# Patient Record
Sex: Male | Born: 1975 | Race: Black or African American | Hispanic: No | Marital: Single | Smoking: Former smoker
Health system: Southern US, Community
[De-identification: ages and names within clinical notes are randomized; demographics above are authoritative.]

## PROBLEM LIST (undated history)

## (undated) DIAGNOSIS — Z8719 Personal history of other diseases of the digestive system: Secondary | ICD-10-CM

## (undated) HISTORY — DX: Personal history of other diseases of the digestive system: Z87.19

## (undated) HISTORY — PX: COLONOSCOPY: SHX174

---

## 2011-06-15 DIAGNOSIS — Z0000 Encounter for general adult medical examination without abnormal findings: Secondary | ICD-10-CM | POA: Insufficient documentation

## 2011-06-15 DIAGNOSIS — Z8719 Personal history of other diseases of the digestive system: Secondary | ICD-10-CM | POA: Insufficient documentation

## 2011-06-15 HISTORY — DX: Encounter for general adult medical examination without abnormal findings: Z00.00

## 2016-10-27 ENCOUNTER — Encounter: Payer: Self-pay | Admitting: Emergency Medicine

## 2016-10-27 ENCOUNTER — Emergency Department: Payer: Self-pay

## 2016-10-27 ENCOUNTER — Emergency Department
Admission: EM | Admit: 2016-10-27 | Discharge: 2016-10-27 | Disposition: A | Discharge status: Home / Self Care | Payer: Self-pay | Attending: Emergency Medicine | Admitting: Emergency Medicine

## 2016-10-27 DIAGNOSIS — W2101XA Struck by football, initial encounter: Secondary | ICD-10-CM | POA: Insufficient documentation

## 2016-10-27 DIAGNOSIS — Y9361 Activity, american tackle football: Secondary | ICD-10-CM | POA: Insufficient documentation

## 2016-10-27 DIAGNOSIS — Y929 Unspecified place or not applicable: Secondary | ICD-10-CM | POA: Insufficient documentation

## 2016-10-27 DIAGNOSIS — Y998 Other external cause status: Secondary | ICD-10-CM | POA: Insufficient documentation

## 2016-10-27 DIAGNOSIS — F17210 Nicotine dependence, cigarettes, uncomplicated: Secondary | ICD-10-CM | POA: Insufficient documentation

## 2016-10-27 DIAGNOSIS — S022XXA Fracture of nasal bones, initial encounter for closed fracture: Secondary | ICD-10-CM | POA: Insufficient documentation

## 2016-10-27 MED ORDER — OXYCODONE-ACETAMINOPHEN 5-325 MG PO TABS
2.0000 | ORAL_TABLET | Freq: Once | ORAL | Status: AC
  Administered 2016-10-27: 2 via ORAL
  Filled 2016-10-27: qty 2

## 2016-10-27 MED ORDER — OXYCODONE-ACETAMINOPHEN 5-325 MG PO TABS: 1.0000 | ORAL_TABLET | Freq: Four times a day (QID) | ORAL | 0 refills | Status: DC | PRN

## 2016-10-27 MED ORDER — ONDANSETRON 4 MG PO TBDP
4.0000 mg | ORAL_TABLET | Freq: Once | ORAL | Status: AC
  Administered 2016-10-27: 4 mg via ORAL
  Filled 2016-10-27: qty 1

## 2016-10-27 NOTE — Discharge Instructions (Signed)
Please contact the ENT doctor should she continue to have swelling and pain. Continue to place ice to the area and take the oxycodone.

## 2016-10-27 NOTE — ED Triage Notes (Signed)
Pt presents to ED with bruising and swelling to his left eye after he was struck in the bridge of his nose with a football Sunday. Pt reports he noticed the bruising and slight swelling spreading to his right eye as well. Denies any other injury. Denies loc

## 2016-10-27 NOTE — ED Provider Notes (Signed)
[facility] Emergency Department Provider Note   ____________________________________________   First MD Initiated Contact with Patient 10/27/16 [phone]     (approximate)  I have reviewed the triage vital signs and the nursing notes.   HISTORY  Chief Complaint Facial Swelling and Facial Injury    HPI Erik Snyder is a 41 y.o. male who comes into the hospital today with some facial pain and swelling. He reports he is playing football on Sunday when he turned his head and was hit in the nose with a football. He reports it was not too bad on Monday but this morning when he woke up his left eye was swollen and he could barely open it. He also reports that the pain is worse approximately a 10 out of 10 in intensity. The patient did not take anything for pain. He reports that he feels that something is sticking him under the skin. He was dazed when he was hit with a ball but no syncope and no nausea or vomiting. He is here today for evaluation.   History reviewed. No pertinent past medical history.  There are no active problems to display for this patient.   History reviewed. No pertinent surgical history.  Prior to Admission medications   Medication Sig Start Date End Date Taking? Authorizing Provider  oxyCODONE-acetaminophen (ROXICET) 5-325 MG tablet Take 1 tablet by mouth every 6 (six) hours as needed. 10/27/16   Rebecka Apley, MD    Allergies Morphine and related  No family history on file.  Social History Social History  Substance Use Topics  . Smoking status: Current Every Day Smoker    Packs/day: 0.50    Types: Cigarettes  . Smokeless tobacco: Never Used  . Alcohol use Yes    Review of Systems Constitutional: No fever/chills Eyes: No visual changes. ENT: facial swelling and pain Cardiovascular: Denies chest pain. Respiratory: Denies shortness of breath. Gastrointestinal: No abdominal pain.  No nausea, no vomiting.  No  diarrhea.  No constipation. Genitourinary: Negative for dysuria. Musculoskeletal: Negative for back pain. Skin: facial bruising Neurological: Negative for headaches, focal weakness or numbness.  10-point ROS otherwise negative.  ____________________________________________   PHYSICAL EXAM:  VITAL SIGNS: ED Triage Vitals  Enc Vitals Group     BP 10/27/16 0523 92/78     Pulse Rate 10/27/16 0523 72     Resp 10/27/16 0523 18     Temp 10/27/16 0523 98.5 F (36.9 C)     Temp Source 10/27/16 0523 Oral     SpO2 10/27/16 0523 99 %     Weight 10/27/16 0524 160 lb (72.6 kg)     Height 10/27/16 0524 5\' 10"  (1.778 m)     Head Circumference --      Peak Flow --      Pain Score 10/27/16 0524 10     Pain Loc --      Pain Edu? --      Excl. in GC? --     Constitutional: Alert and oriented. Well appearing and in mild distress. Eyes: Conjunctivae are normal. PERRL. EOMI. Head: Atraumatic. Nose: Tenderness to palpation over nasal bridge Mouth/Throat: Mucous membranes are moist.  Oropharynx non-erythematous. Neck: No cervical spine tenderness to palpation. Cardiovascular: Normal rate, regular rhythm. Grossly normal heart sounds.  Good peripheral circulation. Respiratory: Normal respiratory effort.  No retractions. Lungs CTAB. Gastrointestinal: Soft and nontender. No distention. Positive bowel sounds Musculoskeletal: No lower extremity tenderness nor edema.  Neurologic:  Normal speech and  language.  Skin:  Skin is warm, dry and intact. Bruising underneath bilateral eyes Psychiatric: Mood and affect are normal.   ____________________________________________   LABS (all labs ordered are listed, but only abnormal results are displayed)  Labs Reviewed - No data to display ____________________________________________  EKG  none ____________________________________________  RADIOLOGY  CT max/face ____________________________________________   PROCEDURES  Procedure(s)  performed: None  Procedures  Critical Care performed: No  ____________________________________________   INITIAL IMPRESSION / ASSESSMENT AND PLAN / ED COURSE  Pertinent labs & imaging results that were available during my care of the patient were reviewed by me and considered in my medical decision making (see chart for details).  This is a 41 year old male who comes into the hospital today with some facial swelling and pain. I will send the patient for a CT maxillofacial to evaluate for possible nasal or orbital fracture. I will give the patient 2 Percocet.  Clinical Course as of Oct 28 627  Tue Oct 27, 2016  0627 Acute depressed LEFT nasal bone fracture and displaced osseous nasal septum fracture.  Frontal and midface soft tissue swelling without postseptal hematoma.  Poor dentition.   CT Maxillofacial Wo Contrast [AW]    Clinical Course User Index [AW] Rebecka ApleyAllison P Webster, MD     ____________________________________________   FINAL CLINICAL IMPRESSION(S) / ED DIAGNOSES  Final diagnoses:  Closed fracture of nasal bone, initial encounter      NEW MEDICATIONS STARTED DURING THIS VISIT:  New Prescriptions   OXYCODONE-ACETAMINOPHEN (ROXICET) 5-325 MG TABLET    Take 1 tablet by mouth every 6 (six) hours as needed.     Note:  This document was prepared using Dragon voice recognition software and may include unintentional dictation errors.    Rebecka ApleyAllison P Webster, MD 10/27/16 [phone]

## 2016-10-28 ENCOUNTER — Telehealth: Payer: Self-pay | Admitting: Emergency Medicine

## 2016-10-28 NOTE — Telephone Encounter (Signed)
Pt called 0350 2/14 to request extra day off per his conversation with the doctor; pt informed that his work note was only for 2/14 and he was to f/u with Dr Andee PolesVaught, ENT in 2 days from exam; pt st he was not informed of such; spoke with Dr Zenda AlpersWebster who st pt only to have the 1 day off and to f/u with Dr Andee PolesVaught as she did instruct him when she seen him; pt st that he would like an extra day off because of his swelling; per MD pt informed that his swelling/pain could last several weeks and again only 1 day off excused; pt st "I don't think you understand, I'm just gonna have to come back up there and get seen again"

## 2017-06-04 ENCOUNTER — Emergency Department: Payer: No Typology Code available for payment source

## 2017-06-04 ENCOUNTER — Emergency Department
Admission: EM | Admit: 2017-06-04 | Discharge: 2017-06-04 | Disposition: A | Discharge status: Home / Self Care | Payer: No Typology Code available for payment source | Attending: Emergency Medicine | Admitting: Emergency Medicine

## 2017-06-04 DIAGNOSIS — F17210 Nicotine dependence, cigarettes, uncomplicated: Secondary | ICD-10-CM | POA: Diagnosis not present

## 2017-06-04 DIAGNOSIS — S99912A Unspecified injury of left ankle, initial encounter: Secondary | ICD-10-CM | POA: Diagnosis present

## 2017-06-04 DIAGNOSIS — X509XXA Other and unspecified overexertion or strenuous movements or postures, initial encounter: Secondary | ICD-10-CM | POA: Diagnosis not present

## 2017-06-04 DIAGNOSIS — Y929 Unspecified place or not applicable: Secondary | ICD-10-CM | POA: Diagnosis not present

## 2017-06-04 DIAGNOSIS — Y998 Other external cause status: Secondary | ICD-10-CM | POA: Diagnosis not present

## 2017-06-04 DIAGNOSIS — S93402A Sprain of unspecified ligament of left ankle, initial encounter: Secondary | ICD-10-CM | POA: Insufficient documentation

## 2017-06-04 DIAGNOSIS — Y9301 Activity, walking, marching and hiking: Secondary | ICD-10-CM | POA: Diagnosis not present

## 2017-06-04 MED ORDER — OXYCODONE-ACETAMINOPHEN 5-325 MG PO TABS: 1.0000 | ORAL_TABLET | ORAL | 0 refills | Status: DC | PRN

## 2017-06-04 NOTE — ED Provider Notes (Signed)
[facility] Emergency Department Provider Note    First MD Initiated Contact with Patient 06/04/17 [phone]     (approximate)  I have reviewed the triage vital signs and the nursing notes.   HISTORY  Chief Complaint Ankle Pain    HPI Erik Snyder is a 41 y.o. male presents with history of actually twisting his ankle last night while coming down staircase. Patient states that he "rolled his left ankle with resultant 8 out of 10 pain.   Past medical history None There are no active problems to display for this patient.   Past surgical history None  Prior to Admission medications   Medication Sig Start Date End Date Taking? Authorizing Provider  oxyCODONE-acetaminophen (ROXICET) 5-325 MG tablet Take 1 tablet by mouth every 6 (six) hours as needed. 10/27/16   Rebecka Apley, MD  oxyCODONE-acetaminophen (ROXICET) 5-325 MG tablet Take 1 tablet by mouth every 4 (four) hours as needed for severe pain. 06/04/17   Darci Current, MD    Allergies Morphine and related  No family history on file.  Social History Social History  Substance Use Topics  . Smoking status: Current Every Day Smoker    Packs/day: 0.50    Types: Cigarettes  . Smokeless tobacco: Never Used  . Alcohol use Yes    Review of Systems Constitutional: No fever/chills Eyes: No visual changes. ENT: No sore throat. Cardiovascular: Denies chest pain. Respiratory: Denies shortness of breath. Gastrointestinal: No abdominal pain.  No nausea, no vomiting.  No diarrhea.  No constipation.Positive for left ankle pain Genitourinary: Negative for dysuria. Musculoskeletal: Negative for neck pain.  Negative for back pain. Integumentary: Negative for rash. Neurological: Negative for headaches, focal weakness or numbness.   ____________________________________________   PHYSICAL EXAM:  VITAL SIGNS: ED Triage Vitals  Enc Vitals Group     BP 06/04/17 0557 (!) 120/91     Pulse  Rate 06/04/17 0557 83     Resp 06/04/17 0557 20     Temp 06/04/17 0557 98 F (36.7 C)     Temp Source 06/04/17 0557 Oral     SpO2 06/04/17 0557 100 %     Weight 06/04/17 0553 70.3 kg (155 lb)     Height 06/04/17 0553 1.778 m ( )     Head Circumference --      Peak Flow --      Pain Score 06/04/17 0553 8     Pain Loc --      Pain Edu? --      Excl. in GC? --    Constitutional: Alert and oriented. Well appearing and in no acute distress. Eyes: Conjunctivae are normal.  Head: Atraumatic. Mouth/Throat: Mucous membranes are moist. Oropharynx non-erythematous. Neck: No stridor.   Cardiovascular: Normal rate, regular rhythm. Good peripheral circulation. Grossly normal heart sounds. Respiratory: Normal respiratory effort.  No retractions. Lungs CTAB. Gastrointestinal: Soft and nontender. No distention.  Musculoskeletal: Pain with palpation left lateral malleoli, no appreciable swelling. Pain with active/passive range of motion ankle Neurologic:  Normal speech and language. No gross focal neurologic deficits are appreciated.  Skin:  Skin is warm, dry and intact. No rash noted.    RADIOLOGY I, [employer] Viriginia Amendola, personally viewed and evaluated these images (plain radiographs) as part of my medical decision making, as well as reviewing the written report by the radiologist.  Dg Ankle Complete Left  Result Date: 06/04/2017 CLINICAL DATA:  Ankle injury EXAM: LEFT ANKLE COMPLETE - 3+ VIEW COMPARISON:  None. FINDINGS: There is no evidence of fracture, dislocation, or joint effusion. There is no evidence of arthropathy or other focal bone abnormality. Soft tissues are unremarkable. IMPRESSION: No acute abnormality of the left ankle. Electronically Signed   By: Deatra Robinson M.D.   On: 06/04/2017 06:48      Procedures   ____________________________________________   INITIAL IMPRESSION / ASSESSMENT AND PLAN / ED COURSE  Pertinent labs & imaging results that were available during my  care of the patient were reviewed by me and considered in my medical decision making (see chart for details).  Differential diagnosis: Left ankle sprain Left fibula avulsion fracture Left ankle dislocation  Given above clinical concern x-ray was performed which revealed no evidence of fracture or dislocation      ____________________________________________  FINAL CLINICAL IMPRESSION(S) / ED DIAGNOSES  Final diagnoses:  Sprain of left ankle, unspecified ligament, initial encounter     MEDICATIONS GIVEN DURING THIS VISIT:  Medications - No data to display   NEW OUTPATIENT MEDICATIONS STARTED DURING THIS VISIT:  Discharge Medication List as of 06/04/2017  7:06 AM      Discharge Medication List as of 06/04/2017  7:06 AM      Discharge Medication List as of 06/04/2017  7:06 AM       Note:  This document was prepared using Dragon voice recognition software and may include unintentional dictation errors.    Darci Current, MD 06/04/17 [phone]

## 2017-06-04 NOTE — ED Notes (Signed)
Pt states that he missed a step last night and thinks he sprained his left ankle.

## 2017-06-04 NOTE — ED Triage Notes (Signed)
Pt twisted left ankle yesterday states co pain, pt ambulatory upon arrival.

## 2017-11-09 ENCOUNTER — Emergency Department
Admission: EM | Admit: 2017-11-09 | Discharge: 2017-11-09 | Disposition: A | Discharge status: Home / Self Care | Payer: No Typology Code available for payment source | Attending: Emergency Medicine | Admitting: Emergency Medicine

## 2017-11-09 ENCOUNTER — Emergency Department
Admission: EM | Admit: 2017-11-09 | Discharge: 2017-11-09 | Disposition: A | Discharge status: Home / Self Care | Payer: No Typology Code available for payment source | Source: Home / Self Care

## 2017-11-09 ENCOUNTER — Other Ambulatory Visit: Payer: Self-pay

## 2017-11-09 ENCOUNTER — Encounter: Payer: Self-pay | Admitting: Emergency Medicine

## 2017-11-09 DIAGNOSIS — R51 Headache: Secondary | ICD-10-CM

## 2017-11-09 DIAGNOSIS — Z5321 Procedure and treatment not carried out due to patient leaving prior to being seen by health care provider: Secondary | ICD-10-CM

## 2017-11-09 DIAGNOSIS — J069 Acute upper respiratory infection, unspecified: Secondary | ICD-10-CM | POA: Diagnosis not present

## 2017-11-09 DIAGNOSIS — B9789 Other viral agents as the cause of diseases classified elsewhere: Secondary | ICD-10-CM | POA: Insufficient documentation

## 2017-11-09 DIAGNOSIS — R197 Diarrhea, unspecified: Secondary | ICD-10-CM | POA: Diagnosis not present

## 2017-11-09 DIAGNOSIS — R1110 Vomiting, unspecified: Secondary | ICD-10-CM | POA: Diagnosis not present

## 2017-11-09 DIAGNOSIS — F17210 Nicotine dependence, cigarettes, uncomplicated: Secondary | ICD-10-CM | POA: Diagnosis not present

## 2017-11-09 DIAGNOSIS — R0981 Nasal congestion: Secondary | ICD-10-CM | POA: Diagnosis present

## 2017-11-09 DIAGNOSIS — A084 Viral intestinal infection, unspecified: Secondary | ICD-10-CM | POA: Diagnosis not present

## 2017-11-09 LAB — INFLUENZA PANEL BY PCR (TYPE A & B)
INFLAPCR: NEGATIVE
Influenza B By PCR: NEGATIVE

## 2017-11-09 MED ORDER — ONDANSETRON 4 MG PO TBDP: 4.0000 mg | ORAL_TABLET | Freq: Three times a day (TID) | ORAL | 0 refills | Status: DC | PRN

## 2017-11-09 NOTE — ED Triage Notes (Signed)
Pt with headache started last night.

## 2017-11-09 NOTE — ED Provider Notes (Signed)
[facility] Emergency Department Provider Note  ____________________________________________   First MD Initiated Contact with Patient 11/09/17 1327     (approximate)  I have reviewed the triage vital signs and the nursing notes.   HISTORY  Chief Complaint Nasal Congestion; Diarrhea; and Otalgia   HPI Erik Snyder is a 42 y.o. male is here complaint of nasal congestion that started last evening.  Patient is also had vomiting and diarrhea.  He states in the last 12 hours he has had diarrhea approximately 6 times.  He has not vomited this morning.  Patient currently is drinking a soda and does not feel nauseous.  She rates his pain as a 10/10.  History reviewed. No pertinent past medical history.  There are no active problems to display for this patient.   History reviewed. No pertinent surgical history.  Prior to Admission medications   Medication Sig Start Date End Date Taking? Authorizing Provider  ondansetron (ZOFRAN ODT) 4 MG disintegrating tablet Take 1 tablet (4 mg total) by mouth every 8 (eight) hours as needed for nausea or vomiting. 11/09/17   [name], Suhail Peloquin L, PA-C    Allergies Morphine and related  No family history on file.  Social History Social History   Tobacco Use  . Smoking status: Current Every Day Smoker    Packs/day: 0.50    Types: Cigarettes  . Smokeless tobacco: Never Used  Substance Use Topics  . Alcohol use: Yes  . Drug use: No    Review of Systems Constitutional: No fever/chills Eyes: No visual changes. ENT: No sore throat.  Positive nasal congestion. Cardiovascular: Denies chest pain. Respiratory: Denies shortness of breath.  Positive cough. Gastrointestinal: No abdominal pain.  Positive nausea, positive vomiting.  Positive diarrhea.  No constipation. Genitourinary: Negative for dysuria. Musculoskeletal: Negative for back pain. Skin: Negative for rash. Neurological: Negative for headaches, focal  weakness or numbness. ____________________________________________   PHYSICAL EXAM:  VITAL SIGNS: ED Triage Vitals  Enc Vitals Group     BP 11/09/17 1212 118/80     Pulse Rate 11/09/17 1212 83     Resp 11/09/17 1212 16     Temp 11/09/17 1212 98.4 F (36.9 C)     Temp Source 11/09/17 1212 Oral     SpO2 --      Weight 11/09/17 1212 155 lb (70.3 kg)     Height --      Head Circumference --      Peak Flow --      Pain Score 11/09/17 1210 10     Pain Loc --      Pain Edu? --      Excl. in GC? --    Constitutional: Alert and oriented. Well appearing and in no acute distress. Eyes: Conjunctivae are normal.  Head: Atraumatic. Nose: Mild congestion/rhinnorhea.  TMs are dull bilaterally. Mouth/Throat: Mucous membranes are moist.  Oropharynx non-erythematous. Neck: No stridor.   Hematological/Lymphatic/Immunilogical: No cervical lymphadenopathy. Cardiovascular: Normal rate, regular rhythm. Grossly normal heart sounds.  Good peripheral circulation. Respiratory: Normal respiratory effort.  No retractions. Lungs CTAB. Gastrointestinal: Soft and nontender. No distention.  Bowel sounds are slightly hyperactive x4 quadrants. Musculoskeletal: No lower extremity tenderness nor edema.  No joint effusions. Neurologic:  Normal speech and language. No gross focal neurologic deficits are appreciated. No gait instability. Skin:  Skin is warm, dry and intact. No rash noted. Psychiatric: Mood and affect are normal. Speech and behavior are normal.  ____________________________________________   LABS (all labs ordered are  listed, but only abnormal results are displayed)  Labs Reviewed  INFLUENZA PANEL BY PCR (TYPE A & B)    PROCEDURES  Procedure(s) performed: None  Procedures  Critical Care performed: No  ____________________________________________   INITIAL IMPRESSION / ASSESSMENT AND PLAN / ED COURSE Patient was reassured that influenza test was negative.  He is to continue  drinking clear liquids.  Patient was given prescription for Zofran 1 every 8 hours if nausea vomiting.  Encouraged to take over-the-counter medication for his nasal congestion.  Follow-up with his PCP or [facility] acute care if any continued problems.  ____________________________________________   FINAL CLINICAL IMPRESSION(S) / ED DIAGNOSES  Final diagnoses:  Viral URI with cough  Viral gastroenteritis     ED Discharge Orders        Ordered    ondansetron (ZOFRAN ODT) 4 MG disintegrating tablet  Every 8 hours PRN     11/09/17 1404       Note:  This document was prepared using Dragon voice recognition software and may include unintentional dictation errors.    Tommi Rumps, PA-C 11/09/17 1503    Emily Filbert, MD 11/09/17 [phone]

## 2017-11-09 NOTE — ED Triage Notes (Signed)
Says nasal and head congestion, diarrhea since last night .  Has pain in left ear as well.

## 2017-11-11 ENCOUNTER — Encounter: Payer: Self-pay | Admitting: Emergency Medicine

## 2017-11-11 ENCOUNTER — Emergency Department
Admission: EM | Admit: 2017-11-11 | Discharge: 2017-11-11 | Disposition: A | Discharge status: Home / Self Care | Payer: No Typology Code available for payment source | Attending: Emergency Medicine | Admitting: Emergency Medicine

## 2017-11-11 ENCOUNTER — Other Ambulatory Visit: Payer: Self-pay

## 2017-11-11 DIAGNOSIS — B349 Viral infection, unspecified: Secondary | ICD-10-CM | POA: Insufficient documentation

## 2017-11-11 DIAGNOSIS — F17210 Nicotine dependence, cigarettes, uncomplicated: Secondary | ICD-10-CM | POA: Insufficient documentation

## 2017-11-11 DIAGNOSIS — R1110 Vomiting, unspecified: Secondary | ICD-10-CM | POA: Diagnosis present

## 2017-11-11 NOTE — ED Triage Notes (Signed)
Says no abd pain.  Says it's just a virus.

## 2017-11-11 NOTE — ED Provider Notes (Signed)
[facility] Emergency Department Provider Note   ____________________________________________   None    (approximate)  I have reviewed the triage vital signs and the nursing notes.   HISTORY  Chief Complaint Diarrhea and Emesis    HPI Erik Snyder is a 42 y.o. male patient is here today for extension of his work note from 2 days ago.  Patient has vomiting and diarrhea.  Patient he went to the case [facility] but decided to come to the emergency room because they had a 3-hour wait.  Patient states he does not want exam just a work note to extend him for the next 4 days.  Reviewed patient notes from previous visit showing a diagnosis of a viral illness.   History reviewed. No pertinent past medical history.  There are no active problems to display for this patient.   History reviewed. No pertinent surgical history.  Prior to Admission medications   Medication Sig Start Date End Date Taking? Authorizing Provider  ondansetron (ZOFRAN ODT) 4 MG disintegrating tablet Take 1 tablet (4 mg total) by mouth every 8 (eight) hours as needed for nausea or vomiting. 11/09/17   Tommi Rumps, PA-C    Allergies Morphine and related  No family history on file.  Social History Social History   Tobacco Use  . Smoking status: Current Every Day Smoker    Packs/day: 0.50    Types: Cigarettes  . Smokeless tobacco: Never Used  Substance Use Topics  . Alcohol use: Yes  . Drug use: No    Review of Systems Constitutional: No fever/chills Eyes: No visual changes. ENT: No sore throat. Cardiovascular: Denies chest pain. Respiratory: Denies shortness of breath. Gastrointestinal: No abdominal pain.  Vomiting episode last night.  Loose stools 1 hour prior to arrival.   Genitourinary: Negative for dysuria. Musculoskeletal: Negative for back pain. Skin: Negative for rash. Neurological: Negative for headaches, focal weakness or numbness. Allergic/Immunilogical:  Morphine and related products. ____________________________________________   PHYSICAL EXAM:  VITAL SIGNS: ED Triage Vitals  Enc Vitals Group     BP 11/11/17 1158 120/90     Pulse Rate 11/11/17 1154 81     Resp 11/11/17 1154 14     Temp 11/11/17 1154 98.5 F (36.9 C)     Temp Source 11/11/17 1154 Oral     SpO2 11/11/17 1154 100 %     Weight 11/11/17 1155 155 lb (70.3 kg)     Height --      Head Circumference --      Peak Flow --      Pain Score 11/11/17 1155 9     Pain Loc --      Pain Edu? --      Excl. in GC? --    Constitutional: Alert and oriented. Well appearing and in no acute distress.  Afebrile Cardiovascular: Normal rate, regular rhythm. Grossly normal heart sounds.  Good peripheral circulation. Respiratory: Normal respiratory effort.  No retractions. Lungs CTAB. Gastrointestinal: Soft and nontender. No distention. No abdominal bruits. No CVA tenderness. Neurologic:  Normal speech and language. No gross focal neurologic deficits are appreciated. No gait instability. Skin:  Skin is warm, dry and intact. No rash noted. Psychiatric: Mood and affect are normal. Speech and behavior are normal.  ____________________________________________   LABS (all labs ordered are listed, but only abnormal results are displayed)  Labs Reviewed - No data to display ____________________________________________  EKG   ____________________________________________  RADIOLOGY  ED MD interpretation:  Official radiology report(s): No results found.  ____________________________________________   PROCEDURES  Procedure(s) performed: None  Procedures  Critical Care performed: No  ____________________________________________   INITIAL IMPRESSION / ASSESSMENT AND PLAN / ED COURSE  As part of my medical decision making, I reviewed the following data within the electronic MEDICAL RECORD NUMBER    Resolving viral illness.  Patient advised to follow-up with urgent care/  family care clinic for continued complaint.      ____________________________________________   FINAL CLINICAL IMPRESSION(S) / ED DIAGNOSES  Final diagnoses:  Viral illness     ED Discharge Orders    None       Note:  This document was prepared using Dragon voice recognition software and may include unintentional dictation errors.    Joni ReiningSmith, Margert Edsall K, PA-C 11/11/17 1234    Emily FilbertWilliams, Jonathan E, MD 11/11/17 1240

## 2017-11-11 NOTE — Discharge Instructions (Signed)
Advised patient to follow-up with family practice clinic/urgent care clinic due to policy of giving  2 days work excuse.

## 2017-11-11 NOTE — ED Triage Notes (Signed)
Says diarrhea and vomiting continues.  Last vomited last night after eating soup.  Last diarrhea 1  Hour ago. He says he was going to [facility], but they have a 3 hour wait.  He knows what is wrong, but just needs another work note--says he wants to go through [employer] just for the note.

## 2017-11-11 NOTE — ED Notes (Signed)
See triage note  States he was seen 2 days ago for same sxs'   States he was still feeling nauseated and would like to have an extra extended work note

## 2018-03-08 ENCOUNTER — Other Ambulatory Visit: Payer: Self-pay

## 2018-03-08 ENCOUNTER — Encounter: Payer: Self-pay | Admitting: Emergency Medicine

## 2018-03-08 ENCOUNTER — Emergency Department
Admission: EM | Admit: 2018-03-08 | Discharge: 2018-03-08 | Disposition: A | Discharge status: Home / Self Care | Payer: No Typology Code available for payment source | Attending: Emergency Medicine | Admitting: Emergency Medicine

## 2018-03-08 DIAGNOSIS — J069 Acute upper respiratory infection, unspecified: Secondary | ICD-10-CM | POA: Insufficient documentation

## 2018-03-08 DIAGNOSIS — F17210 Nicotine dependence, cigarettes, uncomplicated: Secondary | ICD-10-CM | POA: Insufficient documentation

## 2018-03-08 MED ORDER — PSEUDOEPH-BROMPHEN-DM 30-2-10 MG/5ML PO SYRP: 5.0000 mL | ORAL_SOLUTION | Freq: Four times a day (QID) | ORAL | 0 refills | Status: DC | PRN

## 2018-03-08 NOTE — ED Notes (Signed)
Pt ambulatory to POV, without difficulty. VSS, NAD. Discharge instructions, RX and follow up given. All questions answered.

## 2018-03-08 NOTE — Discharge Instructions (Addendum)
Follow-up with [facility] acute care if any continued problems.  Increase fluids.  Use saline nose spray for nasal congestion frequently.  Begin taking Bromfed-DM as needed for cough and congestion.  You may also take Tylenol or ibuprofen if needed for body aches or fever.

## 2018-03-08 NOTE — ED Triage Notes (Signed)
Says yesterday started with congestion inhead, eyes hurt and nasal congestion.

## 2018-03-08 NOTE — ED Provider Notes (Signed)
[facility] Emergency Department Provider Note  ____________________________________________   First MD Initiated Contact with Patient 03/08/18 1227     (approximate)  I have reviewed the triage vital signs and the nursing notes.   HISTORY  Chief Complaint Nasal Congestion   HPI Erik Snyder is a 42 y.o. male is here with complaint of nasal congestion, cough and sneezing that started yesterday.  Patient took 1 dose of DayQuil and 1 dose of NyQuil and states he has had no relief.  He denies any fever, chills, nausea or vomiting.  Patient does smoke cigarettes.  He rates his pain as a 9/10.  History reviewed. No pertinent past medical history.  There are no active problems to display for this patient.   History reviewed. No pertinent surgical history.  Prior to Admission medications   Medication Sig Start Date End Date Taking? Authorizing Provider  brompheniramine-pseudoephedrine-DM 30-2-10 MG/5ML syrup Take 5 mLs by mouth 4 (four) times daily as needed. 03/08/18   [name], [name], PA-C  ondansetron (ZOFRAN ODT) 4 MG disintegrating tablet Take 1 tablet (4 mg total) by mouth every 8 (eight) hours as needed for nausea or vomiting. 11/09/17   [name], [name], PA-C    Allergies Morphine and related  No family history on file.  Social History Social History   Tobacco Use  . Smoking status: Current Every Day Smoker    Packs/day: 0.50    Types: Cigarettes  . Smokeless tobacco: Never Used  Substance Use Topics  . Alcohol use: Yes  . Drug use: No    Review of Systems Constitutional: No fever/chills Eyes: No visual changes. ENT: Positive nasal congestion.  Positive for sneezing. Cardiovascular: Denies chest pain. Respiratory: Denies shortness of breath.  Positive cough. Gastrointestinal: No abdominal pain.  No nausea, no vomiting.  No diarrhea.  Musculoskeletal: Negative for muscle aches. Skin: Negative for rash. Neurological:  Negative for headaches, focal weakness or numbness. ____________________________________________   PHYSICAL EXAM:  VITAL SIGNS: ED Triage Vitals  Enc Vitals Group     BP 03/08/18 1211 106/78     Pulse Rate 03/08/18 1211 81     Resp 03/08/18 1211 14     Temp 03/08/18 1211 98.5 F (36.9 C)     Temp Source 03/08/18 1211 Oral     SpO2 03/08/18 1211 97 %     Weight 03/08/18 1207 165 lb (74.8 kg)     Height 03/08/18 1207 5\' 10"  (1.778 m)     Head Circumference --      Peak Flow --      Pain Score 03/08/18 1207 9     Pain Loc --      Pain Edu? --      Excl. in GC? --     Constitutional: Alert and oriented. Well appearing and in no acute distress. Eyes: Conjunctivae are normal.  Head: Atraumatic. Nose: Moderate congestion/rhinnorhea.  TMs are dull bilaterally. Mouth/Throat: Mucous membranes are moist.  Oropharynx non-erythematous. Neck: No stridor.   Hematological/Lymphatic/Immunilogical: No cervical lymphadenopathy. Cardiovascular: Normal rate, regular rhythm. Grossly normal heart sounds.  Good peripheral circulation. Respiratory: Normal respiratory effort.  No retractions. Lungs CTAB. Musculoskeletal: His upper and lower extremities without any difficulty.  Normal gait was noted. Neurologic:  Normal speech and language. No gross focal neurologic deficits are appreciated.  Skin:  Skin is warm, dry and intact. No rash noted. Psychiatric: Mood and affect are normal. Speech and behavior are normal.  ____________________________________________   LABS (all labs  ordered are listed, but only abnormal results are displayed)  Labs Reviewed - No data to display ____________________________________________  PROCEDURES  Procedure(s) performed: None  Procedures  Critical Care performed: No  ____________________________________________   INITIAL IMPRESSION / ASSESSMENT AND PLAN / ED COURSE  As part of my medical decision making, I reviewed the following data within the  electronic MEDICAL RECORD NUMBER Notes from prior ED visits and [place] Controlled Substance Database  Patient was made aware that he has an upper respiratory infection.  He is continue taking Tylenol or ibuprofen as needed for body aches or headache.  He was given a prescription for Bromfed-DM 4 times daily as needed for cough and congestion.  Patient was given a note to remain out of work [employer].  He is encouraged to increase fluids and also decrease smoking. ____________________________________________   FINAL CLINICAL IMPRESSION(S) / ED DIAGNOSES  Final diagnoses:  Acute upper respiratory infection     ED Discharge Orders        Ordered    brompheniramine-pseudoephedrine-DM 30-2-10 MG/5ML syrup  4 times daily PRN     03/08/18 1253       Note:  This document was prepared using Dragon voice recognition software and may include unintentional dictation errors.    Tommi Rumps, PA-C 03/08/18 1453    Emily Filbert, MD 03/08/18 [phone]

## 2018-04-08 ENCOUNTER — Encounter: Payer: Self-pay | Admitting: Emergency Medicine

## 2018-04-08 ENCOUNTER — Other Ambulatory Visit: Payer: Self-pay

## 2018-04-08 ENCOUNTER — Emergency Department
Admission: EM | Admit: 2018-04-08 | Discharge: 2018-04-08 | Disposition: A | Discharge status: Home / Self Care | Payer: No Typology Code available for payment source | Attending: Emergency Medicine | Admitting: Emergency Medicine

## 2018-04-08 DIAGNOSIS — L659 Nonscarring hair loss, unspecified: Secondary | ICD-10-CM

## 2018-04-08 DIAGNOSIS — B350 Tinea barbae and tinea capitis: Secondary | ICD-10-CM | POA: Insufficient documentation

## 2018-04-08 DIAGNOSIS — F17210 Nicotine dependence, cigarettes, uncomplicated: Secondary | ICD-10-CM | POA: Insufficient documentation

## 2018-04-08 NOTE — Discharge Instructions (Signed)
Apply Lamisil cream twice daily for at least a month.  See a dermatologist if not improving in 1 to 2 weeks.  Clean the clippers very well as you may have fungal spores in your clippers.

## 2018-04-08 NOTE — ED Notes (Signed)
Patient discharged to home per MD order. Patient in stable condition, and deemed medically cleared by ED provider for discharge. Discharge instructions reviewed with patient/family using "Teach Back"; verbalized understanding of medication education and administration, and information about follow-up care. Denies further concerns. ° °

## 2018-04-08 NOTE — ED Provider Notes (Signed)
[facility] Emergency Department Provider Note  ____________________________________________   First MD Initiated Contact with Patient 04/08/18 2321     (approximate)  I have reviewed the triage vital signs and the nursing notes.   HISTORY  Chief Complaint Insect Bite    HPI Erik Snyder is a 42 y.o. male presents emergency department complaining of a questionable spider bite to the top of the head.  States his hair is missing from the site.  He denies any other injuries.  No fever chills or difficulty breathing.  History reviewed. No pertinent past medical history.  There are no active problems to display for this patient.   History reviewed. No pertinent surgical history.  Prior to Admission medications   Medication Sig Start Date End Date Taking? Authorizing Provider  brompheniramine-pseudoephedrine-DM 30-2-10 MG/5ML syrup Take 5 mLs by mouth 4 (four) times daily as needed. 03/08/18   [name], Rhonda L, PA-C  ondansetron (ZOFRAN ODT) 4 MG disintegrating tablet Take 1 tablet (4 mg total) by mouth every 8 (eight) hours as needed for nausea or vomiting. 11/09/17   [name], Rhonda L, PA-C    Allergies Morphine and related  History reviewed. No pertinent family history.  Social History Social History   Tobacco Use  . Smoking status: Current Every Day Smoker    Packs/day: 0.50    Types: Cigarettes  . Smokeless tobacco: Never Used  Substance Use Topics  . Alcohol use: Yes  . Drug use: No    Review of Systems  Constitutional: No fever/chills Eyes: No visual changes. ENT: No sore throat. Respiratory: Denies cough Genitourinary: Negative for dysuria. Musculoskeletal: Negative for back pain. Skin: Negative for rash.  Positive for an area of missing hair    ____________________________________________   PHYSICAL EXAM:  VITAL SIGNS: ED Triage Vitals  Enc Vitals Group     BP 04/08/18 2321 117/82     Pulse Rate 04/08/18 2321  90     Resp 04/08/18 2321 17     Temp 04/08/18 2321 97.7 F (36.5 C)     Temp Source 04/08/18 2321 Oral     SpO2 04/08/18 2321 100 %     Weight 04/08/18 2318 160 lb (72.6 kg)     Height 04/08/18 2318 5\' 10"  (1.778 m)     Head Circumference --      Peak Flow --      Pain Score 04/08/18 2318 6     Pain Loc --      Pain Edu? --      Excl. in GC? --     Constitutional: Alert and oriented. Well appearing and in no acute distress. Eyes: Conjunctivae are normal.  Head: Atraumatic. Nose: No congestion/rhinnorhea. Mouth/Throat: Mucous membranes are moist.   Cardiovascular: Normal rate, regular rhythm. Respiratory: Normal respiratory effort.  No retractions GU: deferred Musculoskeletal: FROM all extremities, warm and well perfused Neurologic:  Normal speech and language.  Skin:  Skin is warm, dry and intact.  The hair is broken in several areas.  There are areas of alopecia noted.  Typical of tinea capitis. psychiatric: Mood and affect are normal. Speech and behavior are normal.  ____________________________________________   LABS (all labs ordered are listed, but only abnormal results are displayed)  Labs Reviewed - No data to display ____________________________________________   ____________________________________________  RADIOLOGY    ____________________________________________   PROCEDURES  Procedure(s) performed: No  Procedures    ____________________________________________   INITIAL IMPRESSION / ASSESSMENT AND PLAN / ED COURSE  Pertinent labs & imaging results that were available during my care of the patient were reviewed by me and considered in my medical decision making (see chart for details).  Patient is a 42 year old male presents emergency department complaining of a spider bite to the top of his head tonight which took away the hair.  On physical exam patient appears well.  There is no obvious bite noted however there is several areas of  alopecia noted along the scalp.  Typical of tinea.  Explained findings to the patient.  He was instructed to use Lamisil cream.  He is to follow-up with dermatology if needed.  States he understands and was discharged in stable condition with a work note for [employer].     As part of my medical decision making, I reviewed the following data within the electronic MEDICAL RECORD NUMBER Nursing notes reviewed and incorporated, Notes from prior ED visits and [place] Controlled Substance Database  ____________________________________________   FINAL CLINICAL IMPRESSION(S) / ED DIAGNOSES  Final diagnoses:  Alopecia  Tinea capitis      NEW MEDICATIONS STARTED DURING THIS VISIT:  New Prescriptions   No medications on file     Note:  This document was prepared using Dragon voice recognition software and may include unintentional dictation errors.    Faythe Ghee, PA-C 04/08/18 2333    Merrily Brittle, MD 04/08/18 [phone]

## 2018-04-08 NOTE — ED Notes (Addendum)
Pt in with area to top of scalp that is missing hair and is painful. States he thinks it was an insect bite. No redness or swelling noted without signs of infection.

## 2018-04-08 NOTE — ED Triage Notes (Signed)
Pt reports he felt something biting his head about an hour ago. Rubbed his head and saw what he thought was spider legs. Pt has small area to his scalp that his hair is missing from.

## 2018-11-09 ENCOUNTER — Emergency Department
Admission: EM | Admit: 2018-11-09 | Discharge: 2018-11-09 | Disposition: A | Discharge status: Home / Self Care | Payer: BLUE CROSS/BLUE SHIELD | Attending: Emergency Medicine | Admitting: Emergency Medicine

## 2018-11-09 ENCOUNTER — Encounter: Payer: Self-pay | Admitting: Emergency Medicine

## 2018-11-09 ENCOUNTER — Other Ambulatory Visit: Payer: Self-pay

## 2018-11-09 DIAGNOSIS — R69 Illness, unspecified: Secondary | ICD-10-CM

## 2018-11-09 DIAGNOSIS — R509 Fever, unspecified: Secondary | ICD-10-CM | POA: Insufficient documentation

## 2018-11-09 DIAGNOSIS — F17210 Nicotine dependence, cigarettes, uncomplicated: Secondary | ICD-10-CM | POA: Diagnosis not present

## 2018-11-09 DIAGNOSIS — R05 Cough: Secondary | ICD-10-CM | POA: Diagnosis present

## 2018-11-09 DIAGNOSIS — M791 Myalgia, unspecified site: Secondary | ICD-10-CM | POA: Diagnosis not present

## 2018-11-09 DIAGNOSIS — J111 Influenza due to unidentified influenza virus with other respiratory manifestations: Secondary | ICD-10-CM | POA: Insufficient documentation

## 2018-11-09 MED ORDER — ACETAMINOPHEN 500 MG PO TABS
1000.0000 mg | ORAL_TABLET | Freq: Once | ORAL | Status: AC
  Administered 2018-11-09: 1000 mg via ORAL
  Filled 2018-11-09: qty 2

## 2018-11-09 NOTE — Discharge Instructions (Addendum)
Follow-up with your primary care provider or can no clinic acute care if any continued problems.  Continue taking Tamiflu twice a day until finished.  Also guaifenesin with codeine 1 teaspoon can be taken every 4-6 hours as needed for cough and congestion.  This medication contains codeine which will also help with body aches.  Continue taking Tylenol or ibuprofen every 4-6 hours as needed for body aches and fever.  Increase fluids.

## 2018-11-09 NOTE — ED Provider Notes (Signed)
[facility] Emergency Department Provider Note  ____________________________________________   None    (approximate)  I have reviewed the triage vital signs and the nursing notes.   HISTORY  Chief Complaint Cough and Fever   HPI Erik Snyder is a 43 y.o. male presents to the ED with complaint of cough, fever and body aches since yesterday.  He states he was seen at [facility] yesterday and tested for the flu.  Patient is unsure of the results of this test.  He was placed on Tamiflu and given a prescription for cough medication.  He has not taken any Tylenol or ibuprofen since last evening.  He has a subjective fever along with his continued body aches.  Currently rates pain as 10/10.    History reviewed. No pertinent past medical history.  There are no active problems to display for this patient.   History reviewed. No pertinent surgical history.  Prior to Admission medications   Medication Sig Start Date End Date Taking? Authorizing Provider  guaiFENesin-codeine 100-10 MG/5ML syrup Take 5 mLs by mouth every 6 (six) hours as needed for cough.   Yes [provider]  oseltamivir (TAMIFLU) 75 MG capsule Take 75 mg by mouth.   Yes [provider]    Allergies Morphine and related  No family history on file.  Social History Social History   Tobacco Use  . Smoking status: Current Every Day Smoker    Packs/day: 0.50    Types: Cigarettes  . Smokeless tobacco: Never Used  Substance Use Topics  . Alcohol use: Yes  . Drug use: No    Review of Systems Constitutional: Subjective fever/chills Eyes: No visual changes. ENT: No sore throat.  Positive nasal congestion. Cardiovascular: Denies chest pain. Respiratory: Denies shortness of breath.  Positive coughing. Gastrointestinal: No abdominal pain.  No nausea, no vomiting.  No diarrhea.  Musculoskeletal: Positive for muscle aches. Skin: Negative for  rash. Neurological: Negative for headaches, focal weakness or numbness. ____________________________________________   PHYSICAL EXAM:  VITAL SIGNS: ED Triage Vitals  Enc Vitals Group     BP 11/09/18 0651 111/63     Pulse Rate 11/09/18 0651 89     Resp 11/09/18 0651 20     Temp 11/09/18 0651 99.5 F (37.5 C)     Temp Source 11/09/18 0651 Oral     SpO2 11/09/18 0651 97 %     Weight 11/09/18 0653 155 lb (70.3 kg)     Height 11/09/18 0653 5\' 10"  (1.778 m)     Head Circumference --      Peak Flow --      Pain Score 11/09/18 0651 10     Pain Loc --      Pain Edu? --      Excl. in GC? --    Constitutional: Alert and oriented. Well appearing and in no acute distress. Eyes: Conjunctivae are normal.  Head: Atraumatic. Nose: Mild congestion/rhinnorhea.  TMs are dull bilaterally. Mouth/Throat: Mucous membranes are moist.  Oropharynx non-erythematous. Neck: No stridor.   Hematological/Lymphatic/Immunilogical: No cervical lymphadenopathy. Cardiovascular: Normal rate, regular rhythm. Grossly normal heart sounds.  Good peripheral circulation. Respiratory: Normal respiratory effort.  No retractions. Lungs CTAB. Gastrointestinal: Soft and nontender. No distention.  Musculoskeletal: Moves upper and lower extremities without any difficulty.  Normal gait was noted. Neurologic:  Normal speech and language. No gross focal neurologic deficits are appreciated. No gait instability. Skin:  Skin is warm, dry and intact. No rash noted. Psychiatric: Mood  and affect are normal. Speech and behavior are normal.  ____________________________________________   LABS (all labs ordered are listed, but only abnormal results are displayed)  Labs Reviewed - No data to display   PROCEDURES  Procedure(s) performed (including Critical Care):  Procedures   ____________________________________________   INITIAL IMPRESSION / ASSESSMENT AND PLAN / ED COURSE  As part of my medical decision making, I  reviewed the following data within the electronic MEDICAL RECORD NUMBER Notes from prior ED visits and [place] Controlled Substance Database  Patient presents to the ED with complaint of cough, congestion, body aches and subjective fever since yesterday.  Patient was seen at [facility] and prescribed Tamiflu and Robitussin-AC.  Patient was told to take the cough medication twice a day.  He is infrequently taken antipyretics for his fever and body aches.  Patient physical exam was consistent with a viral illness.  Chart from [facility] showed that his influenza test was negative.  Patient was made aware that he could take the Robitussin-AC every 4-6 hours as needed for cough and congestion.  He was given Tylenol 1000 mg p.o. while in the ED.  He is encouraged to increase fluids.  He was given a note to remain out of work. ____________________________________________   FINAL CLINICAL IMPRESSION(S) / ED DIAGNOSES  Final diagnoses:  Influenza-like illness     ED Discharge Orders    None       Note:  This document was prepared using Dragon voice recognition software and may include unintentional dictation errors.    Tommi Rumps, PA-C 11/09/18 0848    Arnaldo Natal, MD 11/09/18 [phone]

## 2018-11-09 NOTE — ED Notes (Signed)
See triage note.  States he was seen at [facility] yesterday and dx'd with the flu  Placed on Tamiflu and cough med with codeine  States he feels worse  Having increased body aches and headache  Last tylenol or IBU taken last pm  Low grade fever noted on arrival

## 2018-11-09 NOTE — ED Triage Notes (Signed)
Patient to ER for c/o cough and fever since yesterday. Patient states he was tested for flu at [facility], doesn't know the result. Patient does not know highest temp at home. Patient's significant other also just had the flu.

## 2019-02-06 ENCOUNTER — Emergency Department
Admission: EM | Admit: 2019-02-06 | Discharge: 2019-02-06 | Disposition: A | Discharge status: Home / Self Care | Payer: BLUE CROSS/BLUE SHIELD | Attending: Emergency Medicine | Admitting: Emergency Medicine

## 2019-02-06 ENCOUNTER — Encounter: Payer: Self-pay | Admitting: Emergency Medicine

## 2019-02-06 ENCOUNTER — Other Ambulatory Visit: Payer: Self-pay

## 2019-02-06 DIAGNOSIS — Y939 Activity, unspecified: Secondary | ICD-10-CM | POA: Insufficient documentation

## 2019-02-06 DIAGNOSIS — W01110A Fall on same level from slipping, tripping and stumbling with subsequent striking against sharp glass, initial encounter: Secondary | ICD-10-CM | POA: Insufficient documentation

## 2019-02-06 DIAGNOSIS — S41112A Laceration without foreign body of left upper arm, initial encounter: Secondary | ICD-10-CM | POA: Insufficient documentation

## 2019-02-06 DIAGNOSIS — F17210 Nicotine dependence, cigarettes, uncomplicated: Secondary | ICD-10-CM | POA: Insufficient documentation

## 2019-02-06 DIAGNOSIS — Y929 Unspecified place or not applicable: Secondary | ICD-10-CM | POA: Insufficient documentation

## 2019-02-06 MED ORDER — LIDOCAINE HCL (PF) 1 % IJ SOLN
INTRAMUSCULAR | Status: AC
  Filled 2019-02-06: qty 5

## 2019-02-06 MED ORDER — LIDOCAINE HCL (PF) 1 % IJ SOLN
5.0000 mL | Freq: Once | INTRAMUSCULAR | Status: AC
  Administered 2019-02-06: 01:00:00 5 mL via INTRADERMAL
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
## 2019-02-06 NOTE — ED Triage Notes (Signed)
Pt says he fell tonight and broke a glass when he did; laceration to left forearm; bleeding controlled; positive sensation and movement to hand/fingers;

## 2019-02-18 ENCOUNTER — Emergency Department
Admission: EM | Admit: 2019-02-18 | Discharge: 2019-02-18 | Disposition: A | Discharge status: Home / Self Care | Payer: Self-pay | Attending: Emergency Medicine | Admitting: Emergency Medicine

## 2019-02-18 ENCOUNTER — Other Ambulatory Visit: Payer: Self-pay

## 2019-02-18 DIAGNOSIS — F17210 Nicotine dependence, cigarettes, uncomplicated: Secondary | ICD-10-CM | POA: Insufficient documentation

## 2019-02-18 DIAGNOSIS — Z4802 Encounter for removal of sutures: Secondary | ICD-10-CM | POA: Insufficient documentation

## 2019-02-18 NOTE — ED Triage Notes (Signed)
Pt here for suture removal of the left elbow

## 2019-02-18 NOTE — ED Provider Notes (Signed)
[facility] Emergency Department Provider Note  ________________  Time seen: 1:12 PM  I have reviewed the triage vital signs and the nursing notes.   HISTORY  Chief Complaint Suture / Staple Removal   HPI  Erik Snyder is a 43 y.o. male presents to the emergency department for suture removal.  Sutures were inserted here on 02/06/2019.  He denies complaints.   History reviewed. No pertinent past medical history.  There are no active problems to display for this patient.   History reviewed. No pertinent surgical history.    Allergies Morphine and related  No family history on file.  Social History Social History   Tobacco Use  . Smoking status: Current Every Day Smoker    Packs/day: 0.50    Types: Cigarettes  . Smokeless tobacco: Never Used  Substance Use Topics  . Alcohol use: Yes  . Drug use: No    Review of Systems  Constitutional: Denies fever.  HEENT: No change from baseline Respiratory: No cough or shortness of breath Musculoskeletal: No pain. Skin: healing wound; pain gradually resolving.  ____________________________________________   PHYSICAL EXAM:  VITAL SIGNS: ED Triage Vitals  Enc Vitals Group     BP 02/18/19 1240 117/70     Pulse Rate 02/18/19 1240 94     Resp 02/18/19 1240 16     Temp 02/18/19 1240 98.2 F (36.8 C)     Temp Source 02/18/19 1240 Oral     SpO2 02/18/19 1240 98 %     Weight 02/18/19 1238 160 lb (72.6 kg)     Height 02/18/19 1238 5\' 10"  (1.778 m)     Head Circumference --      Peak Flow --      Pain Score 02/18/19 1238 0     Pain Loc --      Pain Edu? --      Excl. in [place]? --     Constitutional: Appears well. No distress HEENT: Atraumtaic, normal appearance, sclera normal, voice normal. Respiratory: Respirations even and unlabored.  Cardiovascular: Capillary refill normal. Peripheral pulses 2+ Musculoskeletal: Full ROM x 4. Skin: Well approximated. No evidence of infection or  cellulitis. Neurovascular: Gait steady; Alert and oriented x 4.   PROCEDURES  Procedure(s) performed: SUTURE REMOVAL Performed by: Vaughan Basta, RN  Location details: Left forearm  Wound Appearance: clean  Sutures/Staples Removed: 5  Facility: sutures placed in this facility  Patient tolerance: Patient tolerated the procedure well with no immediate complications.    ____________________________________________   INITIAL IMPRESSION / ASSESSMENT AND PLAN / ED COURSE  Pertinent labs & imaging results that were available during my care of the patient were reviewed by me and considered in my medical decision making (see chart for details).   Wound care discussed. Patient advised to keep covered with sunscreen. Patient was advised to return to the ER for symptoms that change or worsen if unable to schedule an appointment with primary care.  ____________________________________________   FINAL CLINICAL IMPRESSION(S) / ED DIAGNOSES  Final diagnoses:  Visit for suture removal      Victorino Dike, FNP 02/18/19 1537    Schuyler Amor, MD 02/19/19 [phone]

## 2019-10-27 ENCOUNTER — Emergency Department
Admission: EM | Admit: 2019-10-27 | Discharge: 2019-10-27 | Disposition: A | Discharge status: Home / Self Care | Payer: Self-pay | Attending: Student in an Organized Health Care Education/Training Program | Admitting: Student in an Organized Health Care Education/Training Program

## 2019-10-27 ENCOUNTER — Other Ambulatory Visit: Payer: Self-pay

## 2019-10-27 ENCOUNTER — Emergency Department: Payer: Self-pay

## 2019-10-27 DIAGNOSIS — B349 Viral infection, unspecified: Secondary | ICD-10-CM | POA: Insufficient documentation

## 2019-10-27 DIAGNOSIS — Z20822 Contact with and (suspected) exposure to covid-19: Secondary | ICD-10-CM | POA: Insufficient documentation

## 2019-10-27 DIAGNOSIS — Z1152 Encounter for screening for COVID-19: Secondary | ICD-10-CM

## 2019-10-27 DIAGNOSIS — R61 Generalized hyperhidrosis: Secondary | ICD-10-CM | POA: Insufficient documentation

## 2019-10-27 DIAGNOSIS — F17210 Nicotine dependence, cigarettes, uncomplicated: Secondary | ICD-10-CM | POA: Insufficient documentation

## 2019-10-27 LAB — COMPREHENSIVE METABOLIC PANEL
ALT: 27 U/L (ref 0–44)
AST: 37 U/L (ref 15–41)
Albumin: 4 g/dL (ref 3.5–5.0)
Alkaline Phosphatase: 71 U/L (ref 38–126)
Anion gap: 8 (ref 5–15)
BUN: 10 mg/dL (ref 6–20)
CO2: 25 mmol/L (ref 22–32)
Calcium: 8.9 mg/dL (ref 8.9–10.3)
Chloride: 106 mmol/L (ref 98–111)
Creatinine, Ser: 0.94 mg/dL (ref 0.61–1.24)
GFR calc Af Amer: >60 mL/min (ref >60)
GFR calc non Af Amer: >60 mL/min (ref >60)
Glucose, Bld: 72 mg/dL (ref 70–99)
Potassium: 3.7 mmol/L (ref 3.5–5.1)
Sodium: 139 mmol/L (ref 135–145)
Total Bilirubin: 0.7 mg/dL (ref 0.3–1.2)
Total Protein: 7.2 g/dL (ref 6.5–8.1)

## 2019-10-27 LAB — CBC WITH DIFFERENTIAL/PLATELET
Abs Immature Granulocytes: 0.01 10*3/uL (ref 0.00–0.07)
Basophils Absolute: 0 10*3/uL (ref 0.0–0.1)
Basophils Relative: 1 %
Eosinophils Absolute: 0.1 10*3/uL (ref 0.0–0.5)
Eosinophils Relative: 1 %
HCT: 41.4 % (ref 39.0–52.0)
Hemoglobin: 14.1 g/dL (ref 13.0–17.0)
Immature Granulocytes: 0 %
Lymphocytes Relative: 38 %
Lymphs Abs: 1.8 10*3/uL (ref 0.7–4.0)
MCH: 32.7 pg (ref 26.0–34.0)
MCHC: 34.1 g/dL (ref 30.0–36.0)
MCV: 96.1 fL (ref 80.0–100.0)
Monocytes Absolute: 0.5 10*3/uL (ref 0.1–1.0)
Monocytes Relative: 11 %
Neutro Abs: 2.3 10*3/uL (ref 1.7–7.7)
Neutrophils Relative %: 49 %
Platelets: 173 10*3/uL (ref 150–400)
RBC: 4.31 MIL/uL (ref 4.22–5.81)
RDW: 12.6 % (ref 11.5–15.5)
WBC: 4.7 10*3/uL (ref 4.0–10.5)
nRBC: 0 % (ref 0.0–0.2)

## 2019-10-27 LAB — URINALYSIS, COMPLETE (UACMP) WITH MICROSCOPIC
Bacteria, UA: NONE SEEN
Bilirubin Urine: NEGATIVE
Glucose, UA: NEGATIVE mg/dL
Hgb urine dipstick: NEGATIVE
Ketones, ur: NEGATIVE mg/dL
Leukocytes,Ua: NEGATIVE
Nitrite: NEGATIVE
Protein, ur: NEGATIVE mg/dL
Specific Gravity, Urine: 1.004 — ABNORMAL LOW (ref 1.005–1.030)
Squamous Epithelial / LPF: NONE SEEN (ref 0–5)
pH: 8 (ref 5.0–8.0)

## 2019-10-27 LAB — SARS CORONAVIRUS 2 (TAT 6-24 HRS): SARS Coronavirus 2: NEGATIVE

## 2019-10-27 NOTE — ED Notes (Signed)
See triage note  Presents with h/a and night sweats  Unsure of fever  And is afebrile on arrival  Denies any cough

## 2019-10-27 NOTE — ED Provider Notes (Signed)
[facility] Emergency Department Provider Note   ____________________________________________   First MD Initiated Contact with Patient 10/27/19 [phone]     (approximate)  I have reviewed the triage vital signs and the nursing notes.   HISTORY  Chief Complaint Night Sweats   HPI Erik Snyder is a 44 y.o. male presents to the ED with complaint of headache and night sweats for 2 days.  Patient has not taken his temperature.  He states that he has been warm and clammy, nausea without vomiting but has had diarrhea for the last 2 days.  He also complains of a headache.  He is unaware of any known exposure to Covid.       No past medical history on file.  There are no problems to display for this patient.   No past surgical history on file.  Prior to Admission medications   Not on File    Allergies Morphine and related  No family history on file.  Social History Social History   Tobacco Use  . Smoking status: Current Every Day Smoker    Packs/day: 0.50    Types: Cigarettes  . Smokeless tobacco: Never Used  Substance Use Topics  . Alcohol use: Yes  . Drug use: No    Review of Systems Constitutional: Subjective fever/chills.  Positive night sweats. Eyes: No visual changes. ENT: No sore throat.  Negative for nasal congestion. Cardiovascular: Denies chest pain. Respiratory: Denies shortness of breath.  Negative for cough. Gastrointestinal: No abdominal pain.  No nausea, no vomiting.  Positive diarrhea.  No constipation. Genitourinary: Negative for dysuria. Musculoskeletal: Positive for muscle aches. Skin: Negative for rash. Neurological: Negative for headaches, focal weakness or numbness. ____________________________________________   PHYSICAL EXAM:  VITAL SIGNS: ED Triage Vitals  Enc Vitals Group     BP 10/27/19 0640 (!) 127/95     Pulse Rate 10/27/19 0640 76     Resp 10/27/19 0640 20     Temp 10/27/19 0640 98.2 F (36.8  C)     Temp Source 10/27/19 0640 Oral     SpO2 10/27/19 0640 100 %     Weight 10/27/19 0641 160 lb (72.6 kg)     Height 10/27/19 0641 5\' 10"  (1.778 m)     Head Circumference --      Peak Flow --      Pain Score --      Pain Loc --      Pain Edu? --      Excl. in [place]? --     Constitutional: Alert and oriented. Well appearing and in no acute distress. Eyes: Conjunctivae are normal.  Head: Atraumatic. Neck: No stridor.   Cardiovascular: Normal rate, regular rhythm. Grossly normal heart sounds.  Good peripheral circulation. Respiratory: Normal respiratory effort.  No retractions. Lungs CTAB. Gastrointestinal: Soft and nontender. No distention.  No CVA tenderness. Musculoskeletal: Moves upper and lower extremities any difficulty.  Normal gait was noted. Neurologic:  Normal speech and language. No gross focal neurologic deficits are appreciated. No gait instability. Skin:  Skin is warm, dry and intact. No rash noted. Psychiatric: Mood and affect are normal. Speech and behavior are normal.  ____________________________________________   LABS (all labs ordered are listed, but only abnormal results are displayed)  Labs Reviewed  URINALYSIS, COMPLETE (UACMP) WITH MICROSCOPIC - Abnormal; Notable for the following components:      Result Value   Color, Urine COLORLESS (*)    APPearance CLEAR (*)    Specific Gravity,  Urine 1.004 (*)    All other components within normal limits  SARS CORONAVIRUS 2 (TAT 6-24 HRS)  CBC WITH DIFFERENTIAL/PLATELET  COMPREHENSIVE METABOLIC PANEL    RADIOLOGY   Official radiology report(s): DG Chest Portable 1 View  Result Date: 10/27/2019 CLINICAL DATA:  Night sweats with nausea EXAM: PORTABLE CHEST 1 VIEW COMPARISON:  None. FINDINGS: Lungs are clear. Heart size and pulmonary vascularity are normal. No adenopathy. No pneumothorax. No bone lesions. IMPRESSION: No abnormality noted. Electronically Signed   By: Lowella Grip III M.D.   On: 10/27/2019  08:30    ____________________________________________   PROCEDURES  Procedure(s) performed (including Critical Care):  Procedures   ____________________________________________   INITIAL IMPRESSION / ASSESSMENT AND PLAN / ED COURSE  As part of my medical decision making, I reviewed the following data within the electronic MEDICAL RECORD NUMBER Notes from prior ED visits and [place] Controlled Substance Database  Erik Snyder was evaluated in Emergency Department on 10/27/2019 for the symptoms described in the history of present illness. He was evaluated in the context of the global COVID-19 pandemic, which necessitated consideration that the patient might be at risk for infection with the SARS-CoV-2 virus that causes COVID-19. Institutional protocols and algorithms that pertain to the evaluation of patients at risk for COVID-19 are in a state of rapid change based on information released by regulatory bodies including the CDC and federal and state organizations. These policies and algorithms were followed during the patient's care in the ED.  Presents to the ED with complaint of night sweats, headache, subjective fever, chills and diarrhea for 2 days.  Patient denies any known Covid exposure.  Lab work was unremarkable and chest x-ray was negative for any acute changes.  A Covid swab was done prior to his discharge and patient is aware that he needs to quarantine until he is received the results of this test and that if it is positive he will need to continue quarantine for an additional 10 days.  ____________________________________________   FINAL CLINICAL IMPRESSION(S) / ED DIAGNOSES  Final diagnoses:  Viral illness  Encounter for screening for COVID-19     ED Discharge Orders    None       Note:  This document was prepared using Dragon voice recognition software and may include unintentional dictation errors.    Johnn Hai, PA-C 10/27/19 1031    Merlyn Lot, MD 10/27/19 1102

## 2019-10-27 NOTE — Discharge Instructions (Signed)
Follow-up with your primary care provider or return to the emergency department if any severe worsening of your symptoms.  Stay hydrated with fluids.  Tylenol or ibuprofen as needed for fever, body aches or headache.  You will need to quarantine until you have seen the results of your test.  This can be seen on my chart.  If your test is positive you will need to quarantine for an additional 10 days.

## 2019-10-27 NOTE — ED Triage Notes (Addendum)
Pt in with co night sweats x 2 nights, nausea, and headache. No hx of the same, denies any recent illness. Unsure of fever, no vomiting, or dysuria.

## 2020-09-25 DIAGNOSIS — M791 Myalgia, unspecified site: Secondary | ICD-10-CM | POA: Diagnosis not present

## 2020-09-25 DIAGNOSIS — R6883 Chills (without fever): Secondary | ICD-10-CM | POA: Diagnosis not present

## 2020-09-25 DIAGNOSIS — Z20822 Contact with and (suspected) exposure to covid-19: Secondary | ICD-10-CM | POA: Diagnosis not present

## 2020-10-24 ENCOUNTER — Encounter: Payer: Self-pay | Admitting: Emergency Medicine

## 2020-10-24 ENCOUNTER — Emergency Department
Admission: EM | Admit: 2020-10-24 | Discharge: 2020-10-24 | Disposition: A | Discharge status: Home / Self Care | Payer: BC Managed Care – PPO | Attending: Emergency Medicine | Admitting: Emergency Medicine

## 2020-10-24 ENCOUNTER — Other Ambulatory Visit: Payer: Self-pay

## 2020-10-24 DIAGNOSIS — Z20822 Contact with and (suspected) exposure to covid-19: Secondary | ICD-10-CM | POA: Insufficient documentation

## 2020-10-24 DIAGNOSIS — M791 Myalgia, unspecified site: Secondary | ICD-10-CM | POA: Insufficient documentation

## 2020-10-24 DIAGNOSIS — R0981 Nasal congestion: Secondary | ICD-10-CM | POA: Insufficient documentation

## 2020-10-24 DIAGNOSIS — J029 Acute pharyngitis, unspecified: Secondary | ICD-10-CM | POA: Insufficient documentation

## 2020-10-24 DIAGNOSIS — R059 Cough, unspecified: Secondary | ICD-10-CM | POA: Diagnosis not present

## 2020-10-24 DIAGNOSIS — R6883 Chills (without fever): Secondary | ICD-10-CM | POA: Insufficient documentation

## 2020-10-24 DIAGNOSIS — R52 Pain, unspecified: Secondary | ICD-10-CM | POA: Diagnosis not present

## 2020-10-24 DIAGNOSIS — F17210 Nicotine dependence, cigarettes, uncomplicated: Secondary | ICD-10-CM | POA: Insufficient documentation

## 2020-10-24 LAB — SARS CORONAVIRUS 2 (TAT 6-24 HRS): SARS Coronavirus 2: NEGATIVE

## 2020-10-24 LAB — GROUP A STREP BY PCR: Group A Strep by PCR: NOT DETECTED

## 2020-10-24 MED ORDER — BENZONATATE 100 MG PO CAPS: 200.0000 mg | ORAL_CAPSULE | Freq: Three times a day (TID) | ORAL | 0 refills | Status: AC | PRN

## 2020-10-24 MED ORDER — ONDANSETRON 8 MG PO TBDP
8.0000 mg | ORAL_TABLET | Freq: Once | ORAL | Status: AC
  Administered 2020-10-24: 8 mg via ORAL
  Filled 2020-10-24: qty 1

## 2020-10-24 MED ORDER — AMOXICILLIN 875 MG PO TABS: 875.0000 mg | ORAL_TABLET | Freq: Two times a day (BID) | ORAL | 0 refills | Status: DC

## 2020-10-24 MED ORDER — DIPHENHYDRAMINE HCL 12.5 MG/5ML PO ELIX
12.5000 mg | ORAL_SOLUTION | Freq: Once | ORAL | Status: AC
  Administered 2020-10-24: 12.5 mg via ORAL
  Filled 2020-10-24: qty 5

## 2020-10-24 MED ORDER — LIDOCAINE VISCOUS HCL 2 % MT SOLN
15.0000 mL | Freq: Once | OROMUCOSAL | Status: AC
  Administered 2020-10-24: 15 mL via OROMUCOSAL
  Filled 2020-10-24: qty 15

## 2020-10-24 MED ORDER — FEXOFENADINE-PSEUDOEPHED ER 60-120 MG PO TB12: 1.0000 | ORAL_TABLET | Freq: Two times a day (BID) | ORAL | 0 refills | Status: DC

## 2020-10-24 NOTE — Discharge Instructions (Signed)
Follow discharge care instruction self quarantine pending results of COVID-19 test.  If test is positive was quarantine additional 10 days.

## 2020-10-24 NOTE — ED Triage Notes (Signed)
Patient ambulatory to triage with steady gait, without difficulty or distress noted; pt reports since last night having sore throat, runny nose, chills and bodyaches

## 2020-10-24 NOTE — ED Provider Notes (Signed)
[facility] Emergency Department Provider Note   ____________________________________________   Event Date/Time   First MD Initiated Contact with Patient 10/24/20 [phone]     (approximate)  I have reviewed the triage vital signs and the nursing notes.   HISTORY  Chief Complaint Sore Throat    HPI Erik Snyder is a 45 y.o. male patient complaint sore throat, runny nose, chills, and body aches.  Onset of complaint started yesterday.  Denies recent travel or known contact with COVID-19.  Patient not taken the Covid vaccine or flu shot.  Rates his pain as 8/10.  Described pain as "achy/".  No palliative measure for complaint.     History reviewed. No pertinent past medical history.  There are no problems to display for this patient.   History reviewed. No pertinent surgical history.  Prior to Admission medications   Medication Sig Start Date End Date Taking? Authorizing Provider  amoxicillin (AMOXIL) 875 MG tablet Take 1 tablet (875 mg total) by mouth 2 (two) times daily. 10/24/20  Yes Joni Reining, PA-C  benzonatate (TESSALON PERLES) 100 MG capsule Take 2 capsules (200 mg total) by mouth 3 (three) times daily as needed. 10/24/20 10/24/21 Yes Joni Reining, PA-C  fexofenadine-pseudoephedrine (ALLEGRA-D) 60-120 MG 12 hr tablet Take 1 tablet by mouth 2 (two) times daily. 10/24/20  Yes Joni Reining, PA-C    Allergies Morphine and related  No family history on file.  Social History Social History   Tobacco Use  . Smoking status: Current Every Day Smoker    Packs/day: 0.50    Types: Cigarettes  . Smokeless tobacco: Never Used  Vaping Use  . Vaping Use: Never used  Substance Use Topics  . Alcohol use: Yes  . Drug use: No    Review of Systems Constitutional: No fever/chills.  Body aches. Eyes: No visual changes. ENT: No sore throat.  Nasal congestion. Cardiovascular: Denies chest pain. Respiratory: Denies shortness of  breath. Gastrointestinal: No abdominal pain.  No nausea, no vomiting.  No diarrhea.  No constipation. Genitourinary: Negative for dysuria. Musculoskeletal: Negative for back pain. Skin: Negative for rash. Neurological: Positive for frontal headaches, but denies focal weakness or numbness. Allergic/Immunilogical: Morphine ____________________________________________   PHYSICAL EXAM:  VITAL SIGNS: ED Triage Vitals [10/24/20 0616]  Enc Vitals Group     BP 132/69     Pulse Rate 70     Resp 18     Temp 98 F (36.7 C)     Temp Source Oral     SpO2 98 %     Weight 160 lb (72.6 kg)     Height 5\' 10"  (1.778 m)     Head Circumference      Peak Flow      Pain Score 8     Pain Loc      Pain Edu?      Excl. in [place]?     Constitutional: Alert and oriented. Well appearing and in no acute distress. Eyes: Conjunctivae are normal. PERRL. EOMI. Head: Atraumatic. Nose: Edematous nasal turbinates. Mouth/Throat: Mucous membranes are moist.  Postnasal drainage.  Oropharynx erythematous. Neck: No stridor. Hematological/Lymphatic/Immunilogical: No cervical lymphadenopathy. Cardiovascular: Normal rate, regular rhythm. Grossly normal heart sounds.  Good peripheral circulation. Respiratory: Normal respiratory effort.  No retractions. Lungs CTAB. Gastrointestinal: Soft and nontender. No distention. No abdominal bruits. No CVA tenderness. Neurologic:  Normal speech and language. No gross focal neurologic deficits are appreciated. No gait instability. Skin:  Skin is warm, dry  and intact. No rash noted. Psychiatric: Mood and affect are normal. Speech and behavior are normal.  ____________________________________________   LABS (all labs ordered are listed, but only abnormal results are displayed)  Labs Reviewed  GROUP A STREP BY PCR  SARS CORONAVIRUS 2 (TAT 6-24 HRS)   ____________________________________________  EKG   ____________________________________________  RADIOLOGY Margarite Gouge, personally viewed and evaluated these images (plain radiographs) as part of my medical decision making, as well as reviewing the written report by the radiologist.  ED MD interpretation:    Official radiology report(s): No results found.  ____________________________________________   PROCEDURES  Procedure(s) performed (including Critical Care):  Procedures   ____________________________________________   INITIAL IMPRESSION / ASSESSMENT AND PLAN / ED COURSE  As part of my medical decision making, I reviewed the following data within the electronic MEDICAL RECORD NUMBER         Patient presents with sore throat, runny nose, chills, body aches.  Patient's wife is here with similar complaints.  Patient physical exam consistent with sinus congestion versus sinusitis, viral versus bacterial pharyngitis, or COVID-19.  Patient given quarantine instruction and advised to test results to be available later today and can be reviewed in the MyChart app.  If COVID-19 test is positive was quarantine additional 10 days.  Follow discharge care instruction take medication as directed.  Return to ED if condition worsens.      ____________________________________________   FINAL CLINICAL IMPRESSION(S) / ED DIAGNOSES  Final diagnoses:  Sinus congestion  Sore throat  Cough  Myalgia     ED Discharge Orders         Ordered    fexofenadine-pseudoephedrine (ALLEGRA-D) 60-120 MG 12 hr tablet  2 times daily        10/24/20 0743    benzonatate (TESSALON PERLES) 100 MG capsule  3 times daily PRN        10/24/20 0743    amoxicillin (AMOXIL) 875 MG tablet  2 times daily        10/24/20 1324          *Please note:  Jonte Shiller was evaluated in Emergency Department on 10/24/2020 for the symptoms described in the history of present illness. He was evaluated in the context of the global COVID-19 pandemic, which necessitated consideration that the patient might be at risk for  infection with the SARS-CoV-2 virus that causes COVID-19. Institutional protocols and algorithms that pertain to the evaluation of patients at risk for COVID-19 are in a state of rapid change based on information released by regulatory bodies including the CDC and federal and state organizations. These policies and algorithms were followed during the patient's care in the ED.  Some ED evaluations and interventions may be delayed as a result of limited staffing during and the pandemic.*   Note:  This document was prepared using Dragon voice recognition software and may include unintentional dictation errors.    Joni Reining, PA-C 10/24/20 0749    Concha Se, MD 10/24/20 [phone]

## 2020-10-28 ENCOUNTER — Other Ambulatory Visit: Payer: Self-pay

## 2020-10-28 ENCOUNTER — Emergency Department
Admission: EM | Admit: 2020-10-28 | Discharge: 2020-10-28 | Disposition: A | Discharge status: Home / Self Care | Payer: BC Managed Care – PPO | Attending: Emergency Medicine | Admitting: Emergency Medicine

## 2020-10-28 ENCOUNTER — Encounter: Payer: Self-pay | Admitting: Emergency Medicine

## 2020-10-28 DIAGNOSIS — R059 Cough, unspecified: Secondary | ICD-10-CM | POA: Diagnosis not present

## 2020-10-28 DIAGNOSIS — J028 Acute pharyngitis due to other specified organisms: Secondary | ICD-10-CM | POA: Diagnosis not present

## 2020-10-28 DIAGNOSIS — R0602 Shortness of breath: Secondary | ICD-10-CM | POA: Diagnosis not present

## 2020-10-28 DIAGNOSIS — J069 Acute upper respiratory infection, unspecified: Secondary | ICD-10-CM | POA: Diagnosis not present

## 2020-10-28 DIAGNOSIS — B9789 Other viral agents as the cause of diseases classified elsewhere: Secondary | ICD-10-CM | POA: Diagnosis not present

## 2020-10-28 DIAGNOSIS — F17210 Nicotine dependence, cigarettes, uncomplicated: Secondary | ICD-10-CM | POA: Insufficient documentation

## 2020-10-28 DIAGNOSIS — J029 Acute pharyngitis, unspecified: Secondary | ICD-10-CM | POA: Diagnosis not present

## 2020-10-28 DIAGNOSIS — Z20822 Contact with and (suspected) exposure to covid-19: Secondary | ICD-10-CM | POA: Diagnosis not present

## 2020-10-28 NOTE — ED Triage Notes (Signed)
C?O sore throat and runny nose x 1 week.  AAOx3.  Skin warm and dry. NAD

## 2020-10-28 NOTE — Discharge Instructions (Signed)
Follow discharge care instruction continue previous medications. °

## 2020-10-28 NOTE — ED Provider Notes (Signed)
[facility] Emergency Department Provider Note   ____________________________________________   Event Date/Time   First MD Initiated Contact with Patient 10/28/20 1123     (approximate)  I have reviewed the triage vital signs and the nursing notes.   HISTORY  Chief Complaint Sore Throat    HPI Erik Snyder is a 45 y.o. male patient presents for evaluation of sore throat and runny nose.  Patient was seen 4 days ago.  Patient had a negative strep and COVID-19 test on date of visit.  Patient start taking the COVID-19 or flu.  Patient states continue to have symptoms and his job sent him back for reevaluation.         History reviewed. No pertinent past medical history.  There are no problems to display for this patient.   History reviewed. No pertinent surgical history.  Prior to Admission medications   Medication Sig Start Date End Date Taking? Authorizing Provider  amoxicillin (AMOXIL) 875 MG tablet Take 1 tablet (875 mg total) by mouth 2 (two) times daily. 10/24/20   Joni Reining, PA-C  benzonatate (TESSALON PERLES) 100 MG capsule Take 2 capsules (200 mg total) by mouth 3 (three) times daily as needed. 10/24/20 10/24/21  Joni Reining, PA-C  fexofenadine-pseudoephedrine (ALLEGRA-D) 60-120 MG 12 hr tablet Take 1 tablet by mouth 2 (two) times daily. 10/24/20   Joni Reining, PA-C    Allergies Morphine and related  No family history on file.  Social History Social History   Tobacco Use  . Smoking status: Current Every Day Smoker    Packs/day: 0.50    Types: Cigarettes  . Smokeless tobacco: Never Used  Vaping Use  . Vaping Use: Never used  Substance Use Topics  . Alcohol use: Yes  . Drug use: No    Review of Systems Constitutional: No fever/chills Eyes: No visual changes. ENT: Runny nose or sore throat. Cardiovascular: Denies chest pain. Respiratory: Denies shortness of breath. Gastrointestinal: No abdominal pain.  No  nausea, no vomiting.  No diarrhea.  No constipation. Genitourinary: Negative for dysuria. Musculoskeletal: Negative for back pain. Skin: Negative for rash. Neurological: Negative for headaches, focal weakness or numbness. Allergic/Immunilogical: Morphine  ____________________________________________   PHYSICAL EXAM:  VITAL SIGNS: ED Triage Vitals  Enc Vitals Group     BP 10/28/20 0924 (!) 133/94     Pulse Rate 10/28/20 0924 (!) 107     Resp 10/28/20 0924 18     Temp 10/28/20 0924 99.1 F (37.3 C)     Temp Source 10/28/20 0924 Oral     SpO2 10/28/20 0924 98 %     Weight 10/28/20 0910 160 lb 0.9 oz (72.6 kg)     Height 10/28/20 0910 5\' 10"  (1.778 m)     Head Circumference --      Peak Flow --      Pain Score 10/28/20 0909 0     Pain Loc --      Pain Edu? --      Excl. in [place]? --    Constitutional: Alert and oriented. Well appearing and in no acute distress. Eyes: Conjunctivae are normal. PERRL. EOMI. Head: Atraumatic. Nose: No congestion/rhinnorhea. Mouth/Throat: Mucous membranes are moist.  Postnasal drainage.  Oropharynx non-erythematous. Neck: No stridor.  Hematological/Lymphatic/Immunilogical: No cervical lymphadenopathy. Cardiovascular: Tachycardic, regular rhythm. Grossly normal heart sounds.  Good peripheral circulation. Respiratory: Normal respiratory effort.  No retractions. Lungs CTAB. Gastrointestinal: Soft and nontender. No distention. No abdominal bruits. No CVA tenderness.  Neurologic:  Normal speech and language. No gross focal neurologic deficits are appreciated. No gait instability. Skin:  Skin is warm, dry and intact. No rash noted. Psychiatric: Mood and affect are normal. Speech and behavior are normal.  ____________________________________________   LABS (all labs ordered are listed, but only abnormal results are displayed)  Labs Reviewed - No data to  display ____________________________________________  EKG   ____________________________________________  RADIOLOGY I, Joni Reining, personally viewed and evaluated these images (plain radiographs) as part of my medical decision making, as well as reviewing the written report by the radiologist.  ED MD interpretation:    Official radiology report(s): No results found.  ____________________________________________   PROCEDURES  Procedure(s) performed (including Critical Care):  Procedures   ____________________________________________   INITIAL IMPRESSION / ASSESSMENT AND PLAN / ED COURSE  As part of my medical decision making, I reviewed the following data within the electronic MEDICAL RECORD NUMBER         Patient presents with sore throat and runny nose for 1 week.  Patient states no improvement or worsening since visit 4 days ago.  Patient given discharge care instruction work note.  Patient advised continue previous medications.  Discussed sequela of viral illness.  Return to ED if condition worsens.      ____________________________________________   FINAL CLINICAL IMPRESSION(S) / ED DIAGNOSES  Final diagnoses:  Viral URI with cough  Viral pharyngitis     ED Discharge Orders    None      *Please note:  Erik Snyder was evaluated in Emergency Department on 10/28/2020 for the symptoms described in the history of present illness. He was evaluated in the context of the global COVID-19 pandemic, which necessitated consideration that the patient might be at risk for infection with the SARS-CoV-2 virus that causes COVID-19. Institutional protocols and algorithms that pertain to the evaluation of patients at risk for COVID-19 are in a state of rapid change based on information released by regulatory bodies including the CDC and federal and state organizations. These policies and algorithms were followed during the patient's care in the ED.  Some ED  evaluations and interventions may be delayed as a result of limited staffing during and the pandemic.*   Note:  This document was prepared using Dragon voice recognition software and may include unintentional dictation errors.    Joni Reining, PA-C 10/28/20 1148    Sharyn Creamer, MD 10/28/20 [phone]

## 2021-03-28 DIAGNOSIS — Z20822 Contact with and (suspected) exposure to covid-19: Secondary | ICD-10-CM | POA: Diagnosis not present

## 2021-03-28 DIAGNOSIS — Z03818 Encounter for observation for suspected exposure to other biological agents ruled out: Secondary | ICD-10-CM | POA: Diagnosis not present

## 2021-03-31 ENCOUNTER — Other Ambulatory Visit: Payer: Self-pay

## 2021-03-31 ENCOUNTER — Emergency Department
Admission: EM | Admit: 2021-03-31 | Discharge: 2021-03-31 | Disposition: A | Discharge status: Home / Self Care | Payer: BC Managed Care – PPO | Attending: Emergency Medicine | Admitting: Emergency Medicine

## 2021-03-31 DIAGNOSIS — Z20822 Contact with and (suspected) exposure to covid-19: Secondary | ICD-10-CM | POA: Insufficient documentation

## 2021-03-31 DIAGNOSIS — R9431 Abnormal electrocardiogram [ECG] [EKG]: Secondary | ICD-10-CM | POA: Diagnosis not present

## 2021-03-31 DIAGNOSIS — R1084 Generalized abdominal pain: Secondary | ICD-10-CM

## 2021-03-31 DIAGNOSIS — R197 Diarrhea, unspecified: Secondary | ICD-10-CM | POA: Diagnosis not present

## 2021-03-31 DIAGNOSIS — R112 Nausea with vomiting, unspecified: Secondary | ICD-10-CM | POA: Insufficient documentation

## 2021-03-31 DIAGNOSIS — F17210 Nicotine dependence, cigarettes, uncomplicated: Secondary | ICD-10-CM | POA: Insufficient documentation

## 2021-03-31 LAB — CBC
HCT: 40.5 % (ref 39.0–52.0)
Hemoglobin: 14 g/dL (ref 13.0–17.0)
MCH: 33.5 pg (ref 26.0–34.0)
MCHC: 34.6 g/dL (ref 30.0–36.0)
MCV: 96.9 fL (ref 80.0–100.0)
Platelets: 209 10*3/uL (ref 150–400)
RBC: 4.18 MIL/uL — ABNORMAL LOW (ref 4.22–5.81)
RDW: 12.9 % (ref 11.5–15.5)
WBC: 7.3 10*3/uL (ref 4.0–10.5)
nRBC: 0 % (ref 0.0–0.2)

## 2021-03-31 LAB — COMPREHENSIVE METABOLIC PANEL
ALT: 31 U/L (ref 0–44)
AST: 62 U/L — ABNORMAL HIGH (ref 15–41)
Albumin: 4 g/dL (ref 3.5–5.0)
Alkaline Phosphatase: 70 U/L (ref 38–126)
Anion gap: 9 (ref 5–15)
BUN: 11 mg/dL (ref 6–20)
CO2: 24 mmol/L (ref 22–32)
Calcium: 8.7 mg/dL — ABNORMAL LOW (ref 8.9–10.3)
Chloride: 103 mmol/L (ref 98–111)
Creatinine, Ser: 1.05 mg/dL (ref 0.61–1.24)
GFR, Estimated: >60 mL/min (ref >60)
Glucose, Bld: 189 mg/dL — ABNORMAL HIGH (ref 70–99)
Potassium: 4.1 mmol/L (ref 3.5–5.1)
Sodium: 136 mmol/L (ref 135–145)
Total Bilirubin: 1 mg/dL (ref 0.3–1.2)
Total Protein: 7.2 g/dL (ref 6.5–8.1)

## 2021-03-31 LAB — RESP PANEL BY RT-PCR (FLU A&B, COVID) ARPGX2
Influenza A by PCR: NEGATIVE
Influenza B by PCR: NEGATIVE
SARS Coronavirus 2 by RT PCR: NEGATIVE

## 2021-03-31 LAB — LIPASE, BLOOD: Lipase: 34 U/L (ref 11–51)

## 2021-03-31 MED ORDER — ONDANSETRON 4 MG PO TBDP: 4.0000 mg | ORAL_TABLET | Freq: Three times a day (TID) | ORAL | 0 refills | Status: DC | PRN

## 2021-03-31 NOTE — ED Triage Notes (Signed)
Pt c/o N/V/D diaphoresis since last night, states he had a coworker tested positive for covid

## 2021-03-31 NOTE — ED Provider Notes (Signed)
[facility] Emergency Department Provider Note   ____________________________________________   Event Date/Time   First MD Initiated Contact with Patient 03/31/21 [phone]     (approximate)  I have reviewed the triage vital signs and the nursing notes.   HISTORY  Chief Complaint Emesis and Diarrhea    HPI Erik Snyder is a 45 y.o. male presents for N/V/D  LOCATION: generalized DURATION: began last night TIMING: stable since onset SEVERITY: severe QUALITY: n/v/d CONTEXT: states he has a coworker that recently tested positive for COVID MODIFYING FACTORS: any po intake causes vomiting ASSOCIATED SYMPTOMS: generalized abdominal pain   Per medical record review, noncontributory history           History reviewed. No pertinent past medical history.  There are no problems to display for this patient.   History reviewed. No pertinent surgical history.  Prior to Admission medications   Medication Sig Start Date End Date Taking? Authorizing Provider  ondansetron (ZOFRAN ODT) 4 MG disintegrating tablet Take 1 tablet (4 mg total) by mouth every 8 (eight) hours as needed for nausea or vomiting. 03/31/21  Yes Kazaria Gaertner, Clent Jacks, MD  amoxicillin (AMOXIL) 875 MG tablet Take 1 tablet (875 mg total) by mouth 2 (two) times daily. 10/24/20   Joni Reining, PA-C  benzonatate (TESSALON PERLES) 100 MG capsule Take 2 capsules (200 mg total) by mouth 3 (three) times daily as needed. 10/24/20 10/24/21  Joni Reining, PA-C  fexofenadine-pseudoephedrine (ALLEGRA-D) 60-120 MG 12 hr tablet Take 1 tablet by mouth 2 (two) times daily. 10/24/20   Joni Reining, PA-C    Allergies Morphine and related  No family history on file.  Social History Social History   Tobacco Use   Smoking status: Every Day    Packs/day: 0.50    Types: Cigarettes   Smokeless tobacco: Never  Vaping Use   Vaping Use: Never used  Substance Use Topics   Alcohol use: Yes   Drug use:  No    Review of Systems Constitutional: No fever/chills Eyes: No visual changes. ENT: No sore throat. Cardiovascular: Denies chest pain. Respiratory: Denies shortness of breath. Gastrointestinal: endorses generalized abdominal pain, nausea, vomiting, diarrhea Genitourinary: Negative for dysuria. Musculoskeletal: Negative for acute arthralgias Skin: Negative for rash. Neurological: Negative for headaches, weakness/numbness/paresthesias in any extremity Psychiatric: Negative for suicidal ideation/homicidal ideation   ____________________________________________   PHYSICAL EXAM:  VITAL SIGNS: ED Triage Vitals  Enc Vitals Group     BP 03/31/21 0755 116/86     Pulse Rate 03/31/21 0755 (!) 103     Resp 03/31/21 0755 18     Temp 03/31/21 0755 98.2 F (36.8 C)     Temp src --      SpO2 03/31/21 0755 100 %     Weight --      Height --      Head Circumference --      Peak Flow --      Pain Score 03/31/21 0751 8     Pain Loc --      Pain Edu? --      Excl. in GC? --    Constitutional: Alert and oriented. Well appearing and in no acute distress. Eyes: Conjunctivae are normal. PERRL. Head: Atraumatic. Nose: No congestion/rhinnorhea. Mouth/Throat: Mucous membranes are moist. Neck: No stridor Cardiovascular: Grossly normal heart sounds.  Good peripheral circulation. Respiratory: Normal respiratory effort.  No retractions. Gastrointestinal: Soft and nontender. No distention. Musculoskeletal: No obvious deformities Neurologic:  Normal speech and  language. No gross focal neurologic deficits are appreciated. Skin:  Skin is warm and dry. No rash noted. Psychiatric: Mood and affect are normal. Speech and behavior are normal.  ____________________________________________   LABS (all labs ordered are listed, but only abnormal results are displayed)  Labs Reviewed  COMPREHENSIVE METABOLIC PANEL - Abnormal; Notable for the following components:      Result Value   Glucose, Bld  189 (*)    Calcium 8.7 (*)    AST 62 (*)    All other components within normal limits  CBC - Abnormal; Notable for the following components:   RBC 4.18 (*)    All other components within normal limits  RESP PANEL BY RT-PCR (FLU A&B, COVID) ARPGX2  LIPASE, BLOOD  URINALYSIS, COMPLETE (UACMP) WITH MICROSCOPIC   ____________________________________________   PROCEDURES  Procedure(s) performed (including Critical Care):  .1-3 Lead EKG Interpretation  Date/Time: 03/31/2021 9:43 AM Performed by: Merwyn Katos, MD Authorized by: Merwyn Katos, MD     Interpretation: normal     ECG rate:  84   ECG rate assessment: normal     Rhythm: sinus rhythm     Ectopy: none     Conduction: normal     ____________________________________________   INITIAL IMPRESSION / ASSESSMENT AND PLAN / ED COURSE  As part of my medical decision making, I reviewed the following data within the electronic medical record, if available:  Nursing notes reviewed and incorporated, Labs reviewed, EKG interpreted, Old chart reviewed, Radiograph reviewed and Notes from prior ED visits reviewed and incorporated        Patient presents for acute nausea/vomiting/diarrhea The cause of the patients symptoms is not clear, but the patient is overall well appearing and is suspected to have a transient course of illness.  Given History and Exam there does not appear to be an emergent cause of the symptoms such as small bowel obstruction, coronary syndrome, bowel ischemia, DKA, pancreatitis, appendicitis, other acute abdomen or other emergent problem.  Reassessment: After treatment, the patient is feeling much better, tolerating PO fluids, and shows no signs of dehydration.   Disposition: Discharge home with prompt primary care physician follow up in the next 48 hours. Strict return precautions discussed.      ____________________________________________   FINAL CLINICAL IMPRESSION(S) / ED DIAGNOSES  Final  diagnoses:  Nausea vomiting and diarrhea  Generalized abdominal pain     ED Discharge Orders          Ordered    ondansetron (ZOFRAN ODT) 4 MG disintegrating tablet  Every 8 hours PRN        03/31/21 0856             Note:  This document was prepared using Dragon voice recognition software and may include unintentional dictation errors.    Merwyn Katos, MD 03/31/21 [phone]

## 2021-03-31 NOTE — ED Triage Notes (Signed)
Pt also states all over belly pain. Pt states 8/10 pain level at this time.

## 2021-03-31 NOTE — ED Notes (Signed)
No discharge e-signature obtained due to e-signature pad not working.  PT verbalized understanding of all discharge instructions, medication, and f/u care.

## 2021-05-08 DIAGNOSIS — Z20822 Contact with and (suspected) exposure to covid-19: Secondary | ICD-10-CM | POA: Diagnosis not present

## 2021-05-08 DIAGNOSIS — Z03818 Encounter for observation for suspected exposure to other biological agents ruled out: Secondary | ICD-10-CM | POA: Diagnosis not present

## 2021-08-24 ENCOUNTER — Other Ambulatory Visit: Payer: Self-pay

## 2021-08-24 ENCOUNTER — Emergency Department
Admission: EM | Admit: 2021-08-24 | Discharge: 2021-08-24 | Disposition: A | Discharge status: Home / Self Care | Payer: BC Managed Care – PPO | Attending: Emergency Medicine | Admitting: Emergency Medicine

## 2021-08-24 ENCOUNTER — Encounter: Payer: Self-pay | Admitting: Emergency Medicine

## 2021-08-24 DIAGNOSIS — R197 Diarrhea, unspecified: Secondary | ICD-10-CM | POA: Insufficient documentation

## 2021-08-24 DIAGNOSIS — R110 Nausea: Secondary | ICD-10-CM | POA: Diagnosis not present

## 2021-08-24 DIAGNOSIS — Z20822 Contact with and (suspected) exposure to covid-19: Secondary | ICD-10-CM | POA: Insufficient documentation

## 2021-08-24 DIAGNOSIS — R059 Cough, unspecified: Secondary | ICD-10-CM | POA: Diagnosis not present

## 2021-08-24 DIAGNOSIS — F17210 Nicotine dependence, cigarettes, uncomplicated: Secondary | ICD-10-CM | POA: Insufficient documentation

## 2021-08-24 DIAGNOSIS — J40 Bronchitis, not specified as acute or chronic: Secondary | ICD-10-CM | POA: Diagnosis not present

## 2021-08-24 LAB — RESP PANEL BY RT-PCR (FLU A&B, COVID) ARPGX2
Influenza A by PCR: NEGATIVE
Influenza B by PCR: NEGATIVE
SARS Coronavirus 2 by RT PCR: NEGATIVE

## 2021-08-24 NOTE — ED Provider Notes (Signed)
[facility] Emergency Department Provider Note   ____________________________________________   Event Date/Time   First MD Initiated Contact with Patient 08/24/21 1105     (approximate)  I have reviewed the triage vital signs and the nursing notes.   HISTORY  Chief Complaint Cough, Generalized Body Aches, and Fever    HPI Mckade Gurka is a 45 y.o. male with no significant past medical history who presents to the ED complaining of cough and fever.  Patient reports that he has been "feeling bad" for the past 2 days with productive cough, body aches, and subjective fevers.  He reports taking a dose of ibuprofen just prior to arrival and is now feeling slightly better.  He reports occasional nausea with diarrhea, but denies any abdominal pain or vomiting.  He denies any pain in his chest or difficulty breathing.  He is not aware of any sick contacts.        History reviewed. No pertinent past medical history.  There are no problems to display for this patient.   History reviewed. No pertinent surgical history.  Prior to Admission medications   Medication Sig Start Date End Date Taking? Authorizing Provider  amoxicillin (AMOXIL) 875 MG tablet Take 1 tablet (875 mg total) by mouth 2 (two) times daily. 10/24/20   Joni Reining, PA-C  benzonatate (TESSALON PERLES) 100 MG capsule Take 2 capsules (200 mg total) by mouth 3 (three) times daily as needed. 10/24/20 10/24/21  Joni Reining, PA-C  fexofenadine-pseudoephedrine (ALLEGRA-D) 60-120 MG 12 hr tablet Take 1 tablet by mouth 2 (two) times daily. 10/24/20   Joni Reining, PA-C  ondansetron (ZOFRAN ODT) 4 MG disintegrating tablet Take 1 tablet (4 mg total) by mouth every 8 (eight) hours as needed for nausea or vomiting. 03/31/21   Merwyn Katos, MD    Allergies Morphine and related  No family history on file.  Social History Social History   Tobacco Use   Smoking status: Every Day     Packs/day: 0.50    Types: Cigarettes   Smokeless tobacco: Never  Vaping Use   Vaping Use: Never used  Substance Use Topics   Alcohol use: Yes   Drug use: No    Review of Systems  Constitutional: Positive for subjective fever/chills Eyes: No visual changes. ENT: No sore throat. Cardiovascular: Denies chest pain. Respiratory: Denies shortness of breath.  Positive for cough. Gastrointestinal: No abdominal pain.  Positive for nausea, no vomiting.  Positive for diarrhea.  No constipation. Genitourinary: Negative for dysuria. Musculoskeletal: Negative for back pain.  Positive for myalgias. Skin: Negative for rash. Neurological: Negative for headaches, focal weakness or numbness.  ____________________________________________   PHYSICAL EXAM:  VITAL SIGNS: ED Triage Vitals  Enc Vitals Group     BP 08/24/21 0934 122/87     Pulse Rate 08/24/21 0934 86     Resp 08/24/21 0934 17     Temp 08/24/21 0934 98.6 F (37 C)     Temp Source 08/24/21 0934 Oral     SpO2 08/24/21 0934 95 %     Weight 08/24/21 0909 160 lb 15 oz (73 kg)     Height 08/24/21 0909 5\' 10"  (1.778 m)     Head Circumference --      Peak Flow --      Pain Score 08/24/21 0909 9     Pain Loc --      Pain Edu? --      Excl. in GC? --  Constitutional: Alert and oriented. Eyes: Conjunctivae are normal. Head: Atraumatic. Nose: No congestion/rhinnorhea. Mouth/Throat: Mucous membranes are moist. Neck: Normal ROM Cardiovascular: Normal rate, regular rhythm. Grossly normal heart sounds.  2+ radial pulses bilaterally. Respiratory: Normal respiratory effort.  No retractions. Lungs CTAB. Gastrointestinal: Soft and nontender. No distention. Genitourinary: deferred Musculoskeletal: No lower extremity tenderness nor edema. Neurologic:  Normal speech and language. No gross focal neurologic deficits are appreciated. Skin:  Skin is warm, dry and intact. No rash noted. Psychiatric: Mood and affect are normal. Speech and  behavior are normal.  ____________________________________________   LABS (all labs ordered are listed, but only abnormal results are displayed)  Labs Reviewed  RESP PANEL BY RT-PCR (FLU A&B, COVID) ARPGX2    PROCEDURES  Procedure(s) performed (including Critical Care):  Procedures   ____________________________________________   INITIAL IMPRESSION / ASSESSMENT AND PLAN / ED COURSE      45 year old male with no significant past medical history presents to the ED complaining of 2 days of cough, subjective fevers, body aches, nausea, and diarrhea.  Patient is well-appearing with reassuring vital signs, is not in any respiratory distress and is maintaining O2 sats on room air.  Symptoms sound consistent with a viral syndrome, testing was performed for COVID-19 and influenza, but is negative.  Patient was offered chest x-ray to assess for pneumonia along with EKG and breathing treatment.  He declines and is requesting to be discharged home at this time, is requesting work note for the next couple of days.  Given reassuring appearance, this is reasonable, and patient was counseled to return to the ED for any new or worsening symptoms.  Patient agrees with plan.      ____________________________________________   FINAL CLINICAL IMPRESSION(S) / ED DIAGNOSES  Final diagnoses:  Bronchitis     ED Discharge Orders     None        Note:  This document was prepared using Dragon voice recognition software and may include unintentional dictation errors.    Chesley Noon, MD 08/24/21 1120

## 2021-08-24 NOTE — ED Triage Notes (Signed)
Pt reports cough, fever, sore throat and bodyaches for the last 1-2 days. Pt concerned he has the flu

## 2021-08-24 NOTE — ED Notes (Signed)
Pt to ED c/o flulike symptoms for 2 days. Pt c/o fever, body aches, congestion, cough. Pt in NAD, ambulatory with steady gait at this time.

## 2021-09-30 DIAGNOSIS — T797XXA Traumatic subcutaneous emphysema, initial encounter: Secondary | ICD-10-CM | POA: Diagnosis not present

## 2021-09-30 DIAGNOSIS — M4854XA Collapsed vertebra, not elsewhere classified, thoracic region, initial encounter for fracture: Secondary | ICD-10-CM | POA: Diagnosis not present

## 2021-09-30 DIAGNOSIS — R200 Anesthesia of skin: Secondary | ICD-10-CM | POA: Diagnosis not present

## 2021-09-30 DIAGNOSIS — S41132A Puncture wound without foreign body of left upper arm, initial encounter: Secondary | ICD-10-CM | POA: Diagnosis not present

## 2021-09-30 DIAGNOSIS — Y33XXXA Other specified events, undetermined intent, initial encounter: Secondary | ICD-10-CM | POA: Diagnosis not present

## 2021-09-30 DIAGNOSIS — S3991XA Unspecified injury of abdomen, initial encounter: Secondary | ICD-10-CM | POA: Diagnosis not present

## 2021-09-30 DIAGNOSIS — L989 Disorder of the skin and subcutaneous tissue, unspecified: Secondary | ICD-10-CM | POA: Diagnosis not present

## 2021-09-30 DIAGNOSIS — S41142A Puncture wound with foreign body of left upper arm, initial encounter: Secondary | ICD-10-CM | POA: Diagnosis not present

## 2021-09-30 DIAGNOSIS — R0902 Hypoxemia: Secondary | ICD-10-CM | POA: Diagnosis not present

## 2021-09-30 DIAGNOSIS — Z23 Encounter for immunization: Secondary | ICD-10-CM | POA: Diagnosis not present

## 2021-09-30 DIAGNOSIS — S80212A Abrasion, left knee, initial encounter: Secondary | ICD-10-CM | POA: Diagnosis not present

## 2021-09-30 DIAGNOSIS — S299XXA Unspecified injury of thorax, initial encounter: Secondary | ICD-10-CM | POA: Diagnosis not present

## 2021-09-30 DIAGNOSIS — M79602 Pain in left arm: Secondary | ICD-10-CM | POA: Diagnosis not present

## 2021-09-30 DIAGNOSIS — S0081XA Abrasion of other part of head, initial encounter: Secondary | ICD-10-CM | POA: Diagnosis not present

## 2021-09-30 DIAGNOSIS — S199XXA Unspecified injury of neck, initial encounter: Secondary | ICD-10-CM | POA: Diagnosis not present

## 2021-09-30 DIAGNOSIS — R58 Hemorrhage, not elsewhere classified: Secondary | ICD-10-CM | POA: Diagnosis not present

## 2021-09-30 DIAGNOSIS — S22060A Wedge compression fracture of T7-T8 vertebra, initial encounter for closed fracture: Secondary | ICD-10-CM | POA: Diagnosis not present

## 2021-09-30 DIAGNOSIS — M47814 Spondylosis without myelopathy or radiculopathy, thoracic region: Secondary | ICD-10-CM | POA: Diagnosis not present

## 2021-09-30 DIAGNOSIS — S4992XA Unspecified injury of left shoulder and upper arm, initial encounter: Secondary | ICD-10-CM | POA: Diagnosis not present

## 2021-09-30 DIAGNOSIS — S60512A Abrasion of left hand, initial encounter: Secondary | ICD-10-CM | POA: Diagnosis not present

## 2021-09-30 DIAGNOSIS — Z720 Tobacco use: Secondary | ICD-10-CM | POA: Diagnosis not present

## 2021-09-30 DIAGNOSIS — R030 Elevated blood-pressure reading, without diagnosis of hypertension: Secondary | ICD-10-CM | POA: Diagnosis not present

## 2021-09-30 DIAGNOSIS — S59902A Unspecified injury of left elbow, initial encounter: Secondary | ICD-10-CM | POA: Diagnosis not present

## 2021-09-30 DIAGNOSIS — R0789 Other chest pain: Secondary | ICD-10-CM | POA: Diagnosis not present

## 2021-09-30 DIAGNOSIS — S41042A Puncture wound with foreign body of left shoulder, initial encounter: Secondary | ICD-10-CM | POA: Diagnosis not present

## 2021-09-30 DIAGNOSIS — S0001XA Abrasion of scalp, initial encounter: Secondary | ICD-10-CM | POA: Diagnosis not present

## 2021-09-30 DIAGNOSIS — S0990XA Unspecified injury of head, initial encounter: Secondary | ICD-10-CM | POA: Diagnosis not present

## 2021-09-30 DIAGNOSIS — S3992XA Unspecified injury of lower back, initial encounter: Secondary | ICD-10-CM | POA: Diagnosis not present

## 2021-09-30 DIAGNOSIS — S41102A Unspecified open wound of left upper arm, initial encounter: Secondary | ICD-10-CM | POA: Diagnosis not present

## 2021-09-30 DIAGNOSIS — R202 Paresthesia of skin: Secondary | ICD-10-CM | POA: Diagnosis not present

## 2021-09-30 DIAGNOSIS — W3400XA Accidental discharge from unspecified firearms or gun, initial encounter: Secondary | ICD-10-CM | POA: Diagnosis not present

## 2021-09-30 DIAGNOSIS — S22070A Wedge compression fracture of T9-T10 vertebra, initial encounter for closed fracture: Secondary | ICD-10-CM | POA: Diagnosis not present

## 2021-10-01 DIAGNOSIS — S41102A Unspecified open wound of left upper arm, initial encounter: Secondary | ICD-10-CM | POA: Diagnosis not present

## 2021-10-01 DIAGNOSIS — R202 Paresthesia of skin: Secondary | ICD-10-CM | POA: Diagnosis not present

## 2021-10-01 DIAGNOSIS — S0081XA Abrasion of other part of head, initial encounter: Secondary | ICD-10-CM | POA: Diagnosis not present

## 2021-10-01 DIAGNOSIS — W3400XA Accidental discharge from unspecified firearms or gun, initial encounter: Secondary | ICD-10-CM | POA: Diagnosis not present

## 2021-10-01 DIAGNOSIS — S22060A Wedge compression fracture of T7-T8 vertebra, initial encounter for closed fracture: Secondary | ICD-10-CM | POA: Diagnosis not present

## 2021-10-01 DIAGNOSIS — S41132A Puncture wound without foreign body of left upper arm, initial encounter: Secondary | ICD-10-CM | POA: Diagnosis not present

## 2021-10-01 DIAGNOSIS — S22070A Wedge compression fracture of T9-T10 vertebra, initial encounter for closed fracture: Secondary | ICD-10-CM | POA: Diagnosis not present

## 2021-10-09 DIAGNOSIS — M79603 Pain in arm, unspecified: Secondary | ICD-10-CM | POA: Diagnosis not present

## 2021-10-09 DIAGNOSIS — M25512 Pain in left shoulder: Secondary | ICD-10-CM | POA: Diagnosis not present

## 2021-10-09 DIAGNOSIS — Z5329 Procedure and treatment not carried out because of patient's decision for other reasons: Secondary | ICD-10-CM | POA: Diagnosis not present

## 2021-10-09 DIAGNOSIS — S4990XA Unspecified injury of shoulder and upper arm, unspecified arm, initial encounter: Secondary | ICD-10-CM | POA: Diagnosis not present

## 2021-10-13 ENCOUNTER — Encounter: Payer: Self-pay | Admitting: Emergency Medicine

## 2021-10-13 ENCOUNTER — Other Ambulatory Visit: Payer: Self-pay

## 2021-10-13 ENCOUNTER — Emergency Department
Admission: EM | Admit: 2021-10-13 | Discharge: 2021-10-13 | Disposition: A | Discharge status: Home / Self Care | Payer: BC Managed Care – PPO | Attending: Emergency Medicine | Admitting: Emergency Medicine

## 2021-10-13 ENCOUNTER — Emergency Department: Payer: BC Managed Care – PPO

## 2021-10-13 DIAGNOSIS — W3400XA Accidental discharge from unspecified firearms or gun, initial encounter: Secondary | ICD-10-CM | POA: Insufficient documentation

## 2021-10-13 DIAGNOSIS — S40252A Superficial foreign body of left shoulder, initial encounter: Secondary | ICD-10-CM | POA: Insufficient documentation

## 2021-10-13 DIAGNOSIS — S4992XA Unspecified injury of left shoulder and upper arm, initial encounter: Secondary | ICD-10-CM | POA: Diagnosis not present

## 2021-10-13 DIAGNOSIS — M7989 Other specified soft tissue disorders: Secondary | ICD-10-CM | POA: Diagnosis not present

## 2021-10-13 LAB — COMPREHENSIVE METABOLIC PANEL
ALT: 25 U/L (ref 0–44)
AST: 45 U/L — ABNORMAL HIGH (ref 15–41)
Albumin: 3.9 g/dL (ref 3.5–5.0)
Alkaline Phosphatase: 72 U/L (ref 38–126)
Anion gap: 9 (ref 5–15)
BUN: 11 mg/dL (ref 6–20)
CO2: 24 mmol/L (ref 22–32)
Calcium: 8.7 mg/dL — ABNORMAL LOW (ref 8.9–10.3)
Chloride: 103 mmol/L (ref 98–111)
Creatinine, Ser: 0.86 mg/dL (ref 0.61–1.24)
GFR, Estimated: >60 mL/min (ref >60)
Glucose, Bld: 99 mg/dL (ref 70–99)
Potassium: 3.6 mmol/L (ref 3.5–5.1)
Sodium: 136 mmol/L (ref 135–145)
Total Bilirubin: 0.7 mg/dL (ref 0.3–1.2)
Total Protein: 7.2 g/dL (ref 6.5–8.1)

## 2021-10-13 LAB — CBC WITH DIFFERENTIAL/PLATELET
Abs Immature Granulocytes: 0.01 10*3/uL (ref 0.00–0.07)
Basophils Absolute: 0 10*3/uL (ref 0.0–0.1)
Basophils Relative: 1 %
Eosinophils Absolute: 0.1 10*3/uL (ref 0.0–0.5)
Eosinophils Relative: 2 %
HCT: 41.3 % (ref 39.0–52.0)
Hemoglobin: 14.2 g/dL (ref 13.0–17.0)
Immature Granulocytes: 0 %
Lymphocytes Relative: 37 %
Lymphs Abs: 2.5 10*3/uL (ref 0.7–4.0)
MCH: 32.7 pg (ref 26.0–34.0)
MCHC: 34.4 g/dL (ref 30.0–36.0)
MCV: 95.2 fL (ref 80.0–100.0)
Monocytes Absolute: 0.6 10*3/uL (ref 0.1–1.0)
Monocytes Relative: 10 %
Neutro Abs: 3.4 10*3/uL (ref 1.7–7.7)
Neutrophils Relative %: 50 %
Platelets: 246 10*3/uL (ref 150–400)
RBC: 4.34 MIL/uL (ref 4.22–5.81)
RDW: 13.2 % (ref 11.5–15.5)
WBC: 6.6 10*3/uL (ref 4.0–10.5)
nRBC: 0 % (ref 0.0–0.2)

## 2021-10-13 MED ORDER — LIDOCAINE-EPINEPHRINE 2 %-1:100000 IJ SOLN
20.0000 mL | Freq: Once | INTRAMUSCULAR | Status: AC
  Administered 2021-10-13: 20 mL
  Filled 2021-10-13: qty 1

## 2021-10-13 MED ORDER — OXYCODONE-ACETAMINOPHEN 5-325 MG PO TABS
1.0000 | ORAL_TABLET | Freq: Once | ORAL | Status: AC
  Administered 2021-10-13: 1 via ORAL
  Filled 2021-10-13: qty 1

## 2021-10-13 MED ORDER — OXYCODONE-ACETAMINOPHEN 5-325 MG PO TABS: 1.0000 | ORAL_TABLET | ORAL | 0 refills | Status: DC | PRN
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
## 2021-10-13 NOTE — ED Notes (Signed)
FOLLOW UP PCP INFO PROVIDED ALL QUESTIONS ANSWERED  

## 2021-10-22 DIAGNOSIS — S41042A Puncture wound with foreign body of left shoulder, initial encounter: Secondary | ICD-10-CM | POA: Diagnosis not present

## 2021-10-22 DIAGNOSIS — W3400XA Accidental discharge from unspecified firearms or gun, initial encounter: Secondary | ICD-10-CM | POA: Diagnosis not present

## 2021-11-06 ENCOUNTER — Ambulatory Visit: Payer: BC Managed Care – PPO | Admitting: Family Medicine

## 2021-11-12 DIAGNOSIS — X58XXXD Exposure to other specified factors, subsequent encounter: Secondary | ICD-10-CM | POA: Diagnosis not present

## 2021-11-12 DIAGNOSIS — M549 Dorsalgia, unspecified: Secondary | ICD-10-CM | POA: Diagnosis not present

## 2021-11-12 DIAGNOSIS — M5134 Other intervertebral disc degeneration, thoracic region: Secondary | ICD-10-CM | POA: Diagnosis not present

## 2021-11-12 DIAGNOSIS — R531 Weakness: Secondary | ICD-10-CM | POA: Diagnosis not present

## 2021-11-12 DIAGNOSIS — S22079D Unspecified fracture of T9-T10 vertebra, subsequent encounter for fracture with routine healing: Secondary | ICD-10-CM | POA: Diagnosis not present

## 2021-11-12 DIAGNOSIS — S22060D Wedge compression fracture of T7-T8 vertebra, subsequent encounter for fracture with routine healing: Secondary | ICD-10-CM | POA: Diagnosis not present

## 2021-11-14 DIAGNOSIS — M7582 Other shoulder lesions, left shoulder: Secondary | ICD-10-CM | POA: Insufficient documentation

## 2021-11-14 DIAGNOSIS — W3400XD Accidental discharge from unspecified firearms or gun, subsequent encounter: Secondary | ICD-10-CM | POA: Diagnosis not present

## 2021-11-14 DIAGNOSIS — S41042A Puncture wound with foreign body of left shoulder, initial encounter: Secondary | ICD-10-CM | POA: Diagnosis not present

## 2021-11-14 DIAGNOSIS — M25512 Pain in left shoulder: Secondary | ICD-10-CM | POA: Diagnosis not present

## 2021-11-14 DIAGNOSIS — M795 Residual foreign body in soft tissue: Secondary | ICD-10-CM | POA: Diagnosis not present

## 2021-11-14 DIAGNOSIS — S41032D Puncture wound without foreign body of left shoulder, subsequent encounter: Secondary | ICD-10-CM | POA: Diagnosis not present

## 2021-11-14 DIAGNOSIS — W3400XA Accidental discharge from unspecified firearms or gun, initial encounter: Secondary | ICD-10-CM | POA: Diagnosis not present

## 2021-11-14 DIAGNOSIS — S41032A Puncture wound without foreign body of left shoulder, initial encounter: Secondary | ICD-10-CM | POA: Insufficient documentation

## 2022-01-02 DIAGNOSIS — M7582 Other shoulder lesions, left shoulder: Secondary | ICD-10-CM | POA: Diagnosis not present

## 2022-01-02 DIAGNOSIS — S41032D Puncture wound without foreign body of left shoulder, subsequent encounter: Secondary | ICD-10-CM | POA: Diagnosis not present

## 2022-01-02 DIAGNOSIS — F0781 Postconcussional syndrome: Secondary | ICD-10-CM | POA: Diagnosis not present

## 2022-01-15 DIAGNOSIS — R413 Other amnesia: Secondary | ICD-10-CM | POA: Diagnosis not present

## 2022-01-15 DIAGNOSIS — Z87828 Personal history of other (healed) physical injury and trauma: Secondary | ICD-10-CM | POA: Diagnosis not present

## 2022-01-15 DIAGNOSIS — G479 Sleep disorder, unspecified: Secondary | ICD-10-CM | POA: Diagnosis not present

## 2022-01-15 DIAGNOSIS — H538 Other visual disturbances: Secondary | ICD-10-CM | POA: Diagnosis not present

## 2022-01-26 ENCOUNTER — Ambulatory Visit (INDEPENDENT_AMBULATORY_CARE_PROVIDER_SITE_OTHER): Payer: BC Managed Care – PPO | Admitting: Internal Medicine

## 2022-01-26 ENCOUNTER — Encounter: Payer: Self-pay | Admitting: Internal Medicine

## 2022-01-26 VITALS — BP 124/76 | HR 105 | Temp 98.4°F | Resp 16 | Ht 70.0 in | Wt 145.4 lb

## 2022-01-26 DIAGNOSIS — Z13220 Encounter for screening for lipoid disorders: Secondary | ICD-10-CM

## 2022-01-26 DIAGNOSIS — Z114 Encounter for screening for human immunodeficiency virus [HIV]: Secondary | ICD-10-CM

## 2022-01-26 DIAGNOSIS — F4310 Post-traumatic stress disorder, unspecified: Secondary | ICD-10-CM | POA: Diagnosis not present

## 2022-01-26 DIAGNOSIS — Z1159 Encounter for screening for other viral diseases: Secondary | ICD-10-CM

## 2022-01-26 NOTE — Patient Instructions (Addendum)
It was great seeing you today! ? ?Plan discussed at today's visit: ?-Blood work ordered today, results will be uploaded to MyChart. Please return fasting at least 8-12 hours any time from 8-12 or 2-4 to have labs done ?-Referral placed Psychiatry, we will call to set this up ?-Call pharmacy and start taking medications Dr. Malvin Johns  ? ?Follow up in: 2 months  ? ?Take care and let us know if you have any questions or concerns prior to your next visit. ? ?Dr. Caralee Ates ? ?
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
## 2022-01-27 ENCOUNTER — Telehealth: Payer: Self-pay

## 2022-01-27 NOTE — Telephone Encounter (Signed)
Pt called and appt scheduled

## 2022-01-27 NOTE — Telephone Encounter (Signed)
Copied from CRM [id number]. Topic: General - Other ?>> Jan 27, 2022 11:53 AM Traci Sermon wrote: ?Reason for CRM: Pt called stating he wanted to speak with PCP about his referral and counseling and requested a call back, please advise. ?

## 2022-02-04 ENCOUNTER — Ambulatory Visit: Payer: BC Managed Care – PPO | Admitting: Internal Medicine
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
## 2022-02-11 ENCOUNTER — Telehealth: Payer: Self-pay

## 2022-02-11 ENCOUNTER — Telehealth: Payer: Self-pay | Admitting: *Deleted

## 2022-02-11 ENCOUNTER — Encounter: Payer: Self-pay | Admitting: Internal Medicine

## 2022-02-11 ENCOUNTER — Ambulatory Visit (INDEPENDENT_AMBULATORY_CARE_PROVIDER_SITE_OTHER): Payer: BC Managed Care – PPO | Admitting: Internal Medicine

## 2022-02-11 VITALS — BP 112/68 | HR 99 | Temp 98.3°F | Resp 16 | Ht 70.0 in | Wt 146.3 lb

## 2022-02-11 DIAGNOSIS — F5105 Insomnia due to other mental disorder: Secondary | ICD-10-CM

## 2022-02-11 DIAGNOSIS — F4310 Post-traumatic stress disorder, unspecified: Secondary | ICD-10-CM | POA: Diagnosis not present

## 2022-02-11 DIAGNOSIS — F99 Mental disorder, not otherwise specified: Secondary | ICD-10-CM | POA: Diagnosis not present

## 2022-02-11 DIAGNOSIS — Z13220 Encounter for screening for lipoid disorders: Secondary | ICD-10-CM | POA: Diagnosis not present

## 2022-02-11 DIAGNOSIS — Z1159 Encounter for screening for other viral diseases: Secondary | ICD-10-CM | POA: Diagnosis not present

## 2022-02-11 DIAGNOSIS — Z114 Encounter for screening for human immunodeficiency virus [HIV]: Secondary | ICD-10-CM | POA: Diagnosis not present

## 2022-02-11 MED ORDER — TRAZODONE HCL 50 MG PO TABS
25.0000 mg | ORAL_TABLET | Freq: Every evening | ORAL | 3 refills | Status: DC | PRN
Start: 2022-02-11 — End: 2022-03-23
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
## 2022-02-11 NOTE — Patient Instructions (Addendum)
It was great seeing you today!  Plan discussed at today's visit: -Blood work ordered today, results will be uploaded to MyChart.  -Discuss Abilify and Nortriptyline with Neurology, especially as you are having side effects with the medications -Try Trazodone at night to help with sleep   Follow up in: 03/30/22  Take care and let us know if you have any questions or concerns prior to your next visit.  Dr. Caralee Ates

## 2022-02-11 NOTE — Chronic Care Management (AMB) (Signed)
  Care Management   Note  02/11/2022 Name: Erik Snyder MRN: [id number] DOB: 1975-12-08  Erik Snyder is a 46 y.o. year old male who is a primary care patient of Margarita Mail, DO. I reached out to Arnette Felts by phone today offer care coordination services.   Erik Snyder was given information about care management services today including:  Care management services include personalized support from designated clinical staff supervised by his physician, including individualized plan of care and coordination with other care providers 24/7 contact phone numbers for assistance for urgent and routine care needs. The patient may stop care management services at any time by phone call to the office staff.  Patient agreed to services and verbal consent obtained.   Follow up plan: Telephone appointment with care management team member scheduled for: 02/13/2022  Burman Nieves, CCMA Care Guide, Embedded Care Coordination [facility]  Care Management  Direct Dial: [phone]

## 2022-02-11 NOTE — Telephone Encounter (Signed)
Can you check status of referral for patient, he has not heard anything?

## 2022-02-12 LAB — COMPLETE METABOLIC PANEL WITH GFR
AG Ratio: 1.4 (calc) (ref 1.0–2.5)
ALT: 36 U/L (ref 9–46)
AST: 42 U/L — ABNORMAL HIGH (ref 10–40)
Albumin: 3.8 g/dL (ref 3.6–5.1)
Alkaline phosphatase (APISO): 75 U/L (ref 36–130)
BUN: 9 mg/dL (ref 7–25)
CO2: 27 mmol/L (ref 20–32)
Calcium: 9 mg/dL (ref 8.6–10.3)
Chloride: 105 mmol/L (ref 98–110)
Creat: 1.1 mg/dL (ref 0.60–1.29)
Globulin: 2.7 g/dL (calc) (ref 1.9–3.7)
Glucose, Bld: 67 mg/dL (ref 65–99)
Potassium: 4.5 mmol/L (ref 3.5–5.3)
Sodium: 140 mmol/L (ref 135–146)
Total Bilirubin: 0.4 mg/dL (ref 0.2–1.2)
Total Protein: 6.5 g/dL (ref 6.1–8.1)
eGFR: 84 mL/min/{1.73_m2} (ref >=60)

## 2022-02-12 LAB — CBC WITH DIFFERENTIAL/PLATELET
Absolute Monocytes: 326 cells/uL (ref 200–950)
Basophils Absolute: 22 cells/uL (ref 0–200)
Basophils Relative: 0.5 %
Eosinophils Absolute: 48 cells/uL (ref 15–500)
Eosinophils Relative: 1.1 %
HCT: 40.7 % (ref 38.5–50.0)
Hemoglobin: 13.7 g/dL (ref 13.2–17.1)
Lymphs Abs: 1756 cells/uL (ref 850–3900)
MCH: 33.2 pg — ABNORMAL HIGH (ref 27.0–33.0)
MCHC: 33.7 g/dL (ref 32.0–36.0)
MCV: 98.5 fL (ref 80.0–100.0)
MPV: 13.5 fL — ABNORMAL HIGH (ref 7.5–12.5)
Monocytes Relative: 7.4 %
Neutro Abs: 2248 cells/uL (ref 1500–7800)
Neutrophils Relative %: 51.1 %
Platelets: 173 10*3/uL (ref 140–400)
RBC: 4.13 10*6/uL — ABNORMAL LOW (ref 4.20–5.80)
RDW: 12.7 % (ref 11.0–15.0)
Total Lymphocyte: 39.9 %
WBC: 4.4 10*3/uL (ref 3.8–10.8)

## 2022-02-12 LAB — ADVANCED WRITTEN NOTIFICATION (AWN) TEST REFUSAL: AWN TEST REFUSED: 7600

## 2022-02-12 LAB — HEPATITIS C ANTIBODY
Hepatitis C Ab: NONREACTIVE
SIGNAL TO CUT-OFF: 0.06 (ref <1.00)

## 2022-02-12 LAB — HIV ANTIBODY (ROUTINE TESTING W REFLEX): HIV 1&2 Ab, 4th Generation: NONREACTIVE

## 2022-02-13 ENCOUNTER — Telehealth: Payer: BC Managed Care – PPO

## 2022-02-13 ENCOUNTER — Telehealth: Payer: Self-pay | Admitting: *Deleted

## 2022-02-13 NOTE — Telephone Encounter (Signed)
   02/13/2022  Erik Snyder 13-Apr-1976 [id number]  Phone call to patient for scheduled appointment. Patient requested that call be re-scheduled for an afternoon appointment. Appointment rescheduled for 02/16/22 at 12pm.   [name], [place] Worker  [facility] Care Management [phone]

## 2022-02-16 ENCOUNTER — Ambulatory Visit: Payer: BC Managed Care – PPO | Admitting: *Deleted

## 2022-02-16 DIAGNOSIS — F4310 Post-traumatic stress disorder, unspecified: Secondary | ICD-10-CM
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
## 2022-02-17 ENCOUNTER — Telehealth: Payer: Self-pay

## 2022-02-17 NOTE — Telephone Encounter (Signed)
   Telephone encounter was:  Successful.  02/17/2022 Name: Erik Snyder MRN: [id number] DOB: [birth year]  Erik Snyder is a 46 y.o. year old male who is a primary care patient of Margarita Mail, DO . The community resource team was consulted for assistance with Financial Difficulties related to utilities, rental, housing, phone.  Care guide performed the following interventions: Spoke with patient about utility and rental assistance through [employer], [employer], [employer] and [employer]. Patient has applied for food stamps and is awaiting approval. Patient received emailed resources.  Follow Up Plan:  Care guide will follow up with patient by phone over the next 7 days.  Lochlyn Zullo, AAS Paralegal, [facility] Care Guide  Embedded Care Coordination [place]  Care Management  [address] [place], [place] [address] ??[email or web address]  ?? [phone]   [email or web address]

## 2022-02-23 ENCOUNTER — Encounter: Payer: Self-pay | Admitting: *Deleted

## 2022-02-23 ENCOUNTER — Ambulatory Visit: Payer: Self-pay | Admitting: *Deleted

## 2022-02-23 DIAGNOSIS — F4310 Post-traumatic stress disorder, unspecified: Secondary | ICD-10-CM

## 2022-02-23 NOTE — Progress Notes (Signed)
This encounter was created in error - please disregard.

## 2022-02-24 NOTE — Patient Instructions (Signed)
Visit Information  Thank you for taking time to visit with me today. Please don't hesitate to contact me if I can be of assistance to you before our next scheduled telephone appointment.  Following are the goals we discussed today:  - begin personal counseling - practice relaxation or meditation daily - talk about feelings with a friend, family or spiritual advisor - practice positive thinking and self-talk  Our next appointment is by telephone on 03/02/22 at 12pm  Please call the care guide team at [phone] if you need to cancel or reschedule your appointment.   If you are experiencing a Mental Health or Behavioral Health Crisis or need someone to talk to, please call the Suicide and Crisis Lifeline: 988   Patient verbalizes understanding of instructions and care plan provided today and agrees to view in MyChart. Active MyChart status and patient understanding of how to access instructions and care plan via MyChart confirmed with patient.     Telephone follow up appointment with care management team member scheduled for: 03/02/22  Verna Czech, LCSW Clinical Social Worker  [facility] Care Management [phone]
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
## 2022-02-25 DIAGNOSIS — G479 Sleep disorder, unspecified: Secondary | ICD-10-CM | POA: Diagnosis not present

## 2022-02-25 DIAGNOSIS — Z87828 Personal history of other (healed) physical injury and trauma: Secondary | ICD-10-CM | POA: Diagnosis not present

## 2022-02-25 DIAGNOSIS — H538 Other visual disturbances: Secondary | ICD-10-CM | POA: Diagnosis not present

## 2022-02-25 DIAGNOSIS — R413 Other amnesia: Secondary | ICD-10-CM | POA: Diagnosis not present

## 2022-02-26 ENCOUNTER — Telehealth: Payer: Self-pay

## 2022-02-26 NOTE — Telephone Encounter (Signed)
   Telephone encounter was:  Successful.  02/26/2022 Name: Erik Snyder MRN: [id number] DOB: [birth year]  Erik Snyder is a 46 y.o. year old male who is a primary care patient of Margarita Mail, DO . The community resource team was consulted for assistance with Financial Difficulties related to utilities.  Care guide performed the following interventions: Spoke with patient he has been approved for food stamps and is in the process of calling the other resources for utility assistance.  Follow Up Plan:  No further follow up planned at this time. The patient has been provided with needed resources.  Alexandre Lightsey, AAS Paralegal, [facility] Care Guide  Embedded Care Coordination [place]  Care Management  [address] [place], [place] [address] ??[email or web address]  ?? [phone]   [email or web address]

## 2022-03-02 ENCOUNTER — Ambulatory Visit: Payer: BC Managed Care – PPO | Admitting: *Deleted

## 2022-03-02 DIAGNOSIS — F4310 Post-traumatic stress disorder, unspecified: Secondary | ICD-10-CM
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
## 2022-03-02 NOTE — Patient Instructions (Signed)
Visit Information  Thank you for taking time to visit with me today. Please don't hesitate to contact me if I can be of assistance to you before our next scheduled telephone appointment.  Following are the goals we discussed today:   - begin personal counseling - practice relaxation or meditation daily - talk about feelings with a friend, family or spiritual advisor - practice positive thinking and self-talk     Our next appointment is by telephone on 03/13/22 at 1pm  Please call the care guide team at [phone] if you need to cancel or reschedule your appointment.   If you are experiencing a Mental Health or Behavioral Health Crisis or need someone to talk to, please call the Suicide and Crisis Lifeline: 988   Patient verbalizes understanding of instructions and care plan provided today and agrees to view in MyChart. Active MyChart status and patient understanding of how to access instructions and care plan via MyChart confirmed with patient.     Telephone follow up appointment with care management team member scheduled for: 03/13/22  Verna Czech, LCSW Clinical Social Worker  [facility] [phone]

## 2022-03-04 ENCOUNTER — Ambulatory Visit: Payer: Self-pay | Admitting: *Deleted
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
## 2022-03-04 NOTE — Telephone Encounter (Signed)
Spoke with pt to see if he wanted to be seen sooner by a different provider and he declined he preferred to wait for Dr Caralee Ates return.
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
## 2022-03-09 ENCOUNTER — Ambulatory Visit: Payer: BC Managed Care – PPO | Admitting: Internal Medicine

## 2022-03-13 ENCOUNTER — Telehealth: Payer: Self-pay | Admitting: *Deleted

## 2022-03-13 ENCOUNTER — Telehealth: Payer: BC Managed Care – PPO

## 2022-03-13 NOTE — Telephone Encounter (Signed)
  Care Management   Follow Up Note   03/13/2022 Name: Erik Snyder MRN: [id number] DOB: 01-15-1976   Referred by: Margarita Mail, DO Reason for referral : Care Coordination   An unsuccessful telephone outreach was attempted today. The patient was referred to the case management team for assistance with care management and care coordination.   Follow Up Plan: Telephone follow up appointment with care management team member scheduled for: 03/20/22  Verna Czech, LCSW Clinical Social Worker  [facility] [phone]

## 2022-03-20 ENCOUNTER — Ambulatory Visit: Payer: BC Managed Care – PPO | Admitting: Internal Medicine

## 2022-03-20 ENCOUNTER — Ambulatory Visit: Payer: BC Managed Care – PPO | Admitting: *Deleted

## 2022-03-20 DIAGNOSIS — F4310 Post-traumatic stress disorder, unspecified: Secondary | ICD-10-CM
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
## 2022-03-22 NOTE — Patient Instructions (Signed)
Visit Information  Thank you for taking time to visit with me today. Please don't hesitate to contact me if I can be of assistance to you before our next scheduled telephone appointment.  Following are the goals we discussed today:   - begin personal counseling - practice relaxation or meditation daily - talk about feelings with a friend, family or spiritual advisor - practice positive thinking and self-talk    If you are experiencing a Mental Health or Behavioral Health Crisis or need someone to talk to, please call the Suicide and Crisis Lifeline: 988   Patient verbalizes understanding of instructions and care plan provided today and agrees to view in MyChart. Active MyChart status and patient understanding of how to access instructions and care plan via MyChart confirmed with patient.     No further follow up required: patient to contact his provider's office with any additional community resource needs  Verna Czech, LCSW Clinical Social Worker  [facility] [phone]
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
## 2022-03-23 ENCOUNTER — Encounter: Payer: Self-pay | Admitting: Internal Medicine

## 2022-03-23 ENCOUNTER — Ambulatory Visit
Admission: RE | Admit: 2022-03-23 | Discharge: 2022-03-23 | Disposition: A | Discharge status: Home / Self Care | Payer: BC Managed Care – PPO | Attending: Internal Medicine | Admitting: Internal Medicine

## 2022-03-23 ENCOUNTER — Ambulatory Visit (INDEPENDENT_AMBULATORY_CARE_PROVIDER_SITE_OTHER): Payer: BC Managed Care – PPO | Admitting: Internal Medicine

## 2022-03-23 ENCOUNTER — Ambulatory Visit
Admission: RE | Admit: 2022-03-23 | Discharge: 2022-03-23 | Disposition: A | Discharge status: Home / Self Care | Payer: BC Managed Care – PPO | Source: Ambulatory Visit | Attending: Internal Medicine | Admitting: Internal Medicine

## 2022-03-23 VITALS — BP 112/76 | HR 110 | Temp 97.8°F | Resp 16 | Ht 70.0 in | Wt 144.2 lb

## 2022-03-23 DIAGNOSIS — G8929 Other chronic pain: Secondary | ICD-10-CM | POA: Diagnosis not present

## 2022-03-23 DIAGNOSIS — F4310 Post-traumatic stress disorder, unspecified: Secondary | ICD-10-CM

## 2022-03-23 DIAGNOSIS — M19012 Primary osteoarthritis, left shoulder: Secondary | ICD-10-CM | POA: Insufficient documentation

## 2022-03-23 DIAGNOSIS — Z87828 Personal history of other (healed) physical injury and trauma: Secondary | ICD-10-CM | POA: Diagnosis not present

## 2022-03-23 DIAGNOSIS — M25512 Pain in left shoulder: Secondary | ICD-10-CM

## 2022-03-23 DIAGNOSIS — S41002A Unspecified open wound of left shoulder, initial encounter: Secondary | ICD-10-CM | POA: Diagnosis not present

## 2022-03-23 MED ORDER — NAPROXEN 500 MG PO TABS: 500.0000 mg | ORAL_TABLET | Freq: Two times a day (BID) | ORAL | 0 refills | Status: AC

## 2022-03-23 NOTE — Patient Instructions (Addendum)
It was great seeing you today!  Plan discussed at today's visit: -Left shoulder x-ray today -Naproxen 500 mg twice a day for 10 days for pain - to not take any other anti-inflammatories with this and take the medication with food  -Referral to physical therapy placed -Call Orthopedic doctor for appointment   Follow up in: 6 weeks   Take care and let us know if you have any questions or concerns prior to your next visit.  Dr. Caralee Ates

## 2022-03-30 ENCOUNTER — Ambulatory Visit: Payer: BC Managed Care – PPO | Admitting: Internal Medicine

## 2022-04-01 DIAGNOSIS — M5412 Radiculopathy, cervical region: Secondary | ICD-10-CM | POA: Diagnosis not present

## 2022-04-01 DIAGNOSIS — S41032D Puncture wound without foreign body of left shoulder, subsequent encounter: Secondary | ICD-10-CM | POA: Diagnosis not present

## 2022-04-13 ENCOUNTER — Telehealth: Payer: Self-pay | Admitting: Internal Medicine

## 2022-04-13 NOTE — Telephone Encounter (Signed)
faxed

## 2022-04-13 NOTE — Telephone Encounter (Signed)
Copied from CRM [id number]. Topic: General - Other >> Apr 13, 2022 12:27 PM Turkey B wrote: Reason for CRM: Lupita Leash from [facility] called in asking fro [facility] 07/10 notes to be faxed in to pay patients disability claim. Fx # is [phone] and she is asking for this claim# to be put on it, of  [id number]

## 2022-04-15 ENCOUNTER — Ambulatory Visit: Payer: BC Managed Care – PPO

## 2022-04-21 ENCOUNTER — Ambulatory Visit: Payer: BC Managed Care – PPO

## 2022-04-22 ENCOUNTER — Ambulatory Visit: Payer: BC Managed Care – PPO

## 2022-04-23 ENCOUNTER — Ambulatory Visit (INDEPENDENT_AMBULATORY_CARE_PROVIDER_SITE_OTHER): Payer: BC Managed Care – PPO | Admitting: Psychiatry

## 2022-04-23 ENCOUNTER — Encounter: Payer: Self-pay | Admitting: Psychiatry

## 2022-04-23 VITALS — BP 119/79 | HR 101 | Temp 98.1°F | Wt 143.4 lb

## 2022-04-23 DIAGNOSIS — F419 Anxiety disorder, unspecified: Secondary | ICD-10-CM | POA: Insufficient documentation

## 2022-04-23 DIAGNOSIS — F4310 Post-traumatic stress disorder, unspecified: Secondary | ICD-10-CM | POA: Diagnosis not present

## 2022-04-23 DIAGNOSIS — F17200 Nicotine dependence, unspecified, uncomplicated: Secondary | ICD-10-CM | POA: Diagnosis not present

## 2022-04-23 HISTORY — DX: Post-traumatic stress disorder, unspecified: F43.10

## 2022-04-23 MED ORDER — HYDROXYZINE HCL 10 MG PO TABS: 10.0000 mg | ORAL_TABLET | Freq: Three times a day (TID) | ORAL | 0 refills | Status: DC | PRN

## 2022-04-23 MED ORDER — MIRTAZAPINE 7.5 MG PO TABS: 7.5000 mg | ORAL_TABLET | Freq: Every day | ORAL | 0 refills | Status: DC

## 2022-04-23 NOTE — Patient Instructions (Signed)
[email or web address]  [email or web address]   [facility] [phone]   [facility] [phone]  [facility] - [phone]   [facility] [address] [facility] Office: [phone] Fax: [phone]    Hydroxyzine Capsules or Tablets What is this medication? HYDROXYZINE (hye DROX i zeen) treats the symptoms of allergies and allergic reactions. It may also be used to treat anxiety or cause drowsiness before a procedure. It works by blocking histamine, a substance released by the body during an allergic reaction. It belongs to a group of medications called antihistamines. This medicine may be used for other purposes; ask your health care provider or pharmacist if you have questions. COMMON BRAND NAME(S): ANX, Atarax, Rezine, Vistaril What should I tell my care team before I take this medication? They need to know if you have any of these conditions: Glaucoma Heart disease History of irregular heartbeat Kidney disease Liver disease Lung or breathing disease, like asthma Stomach or intestine problems Thyroid disease Trouble passing urine An unusual or allergic reaction to hydroxyzine, cetirizine, other medications, foods, dyes or preservatives Pregnant or trying to get pregnant Breast-feeding How should I use this medication? Take this medication by mouth with a full glass of water. Follow the directions on the prescription label. You may take this medication with food or on an empty stomach. Take your medication at regular intervals. Do not take your medication more often than directed. Talk to your care team regarding the use of this medication in children. Special care may be needed. While this medication may be prescribed for children as young as 39 years of age for selected conditions, precautions do apply. Patients over 16 years old may have a stronger reaction and need a smaller dose. Overdosage: If you think you have taken  too much of this medicine contact a poison control center or emergency room at once. NOTE: This medicine is only for you. Do not share this medicine with others. What if I miss a dose? If you miss a dose, take it as soon as you can. If it is almost time for your next dose, take only that dose. Do not take double or extra doses. What may interact with this medication? Do not take this medication with any of the following: Cisapride Dronedarone Pimozide Thioridazine This medication may also interact with the following: Alcohol Antihistamines for allergy, cough, and cold Atropine Barbiturate medications for sleep or seizures, like phenobarbital Certain antibiotics like erythromycin or clarithromycin Certain medications for anxiety or sleep Certain medications for bladder problems like oxybutynin, tolterodine Certain medications for depression or psychotic disturbances Certain medications for irregular heart beat Certain medications for Parkinson's disease like benztropine, trihexyphenidyl Certain medications for seizures like phenobarbital, primidone Certain medications for stomach problems like dicyclomine, hyoscyamine Certain medications for travel sickness like scopolamine Ipratropium Narcotic medications for pain Other medications that prolong the QT interval (which can cause an abnormal heart rhythm) like dofetilide This list may not describe all possible interactions. Give your health care provider a list of all the medicines, herbs, non-prescription drugs, or dietary supplements you use. Also tell them if you smoke, drink alcohol, or use illegal drugs. Some items may interact with your medicine. What should I watch for while using this medication? Tell your care team if your symptoms do not improve. You may get drowsy or dizzy. Do not drive, use machinery, or do anything that needs mental alertness until you know how this medication affects you.  Do not stand or sit up quickly,  especially if you are an older patient. This reduces the risk of dizzy or fainting spells. Alcohol may interfere with the effect of this medication. Avoid alcoholic drinks. Your mouth may get dry. Chewing sugarless gum or sucking hard candy, and drinking plenty of water may help. Contact your care team if the problem does not go away or is severe. This medication may cause dry eyes and blurred vision. If you wear contact lenses you may feel some discomfort. Lubricating drops may help. See your eye care specialist if the problem does not go away or is severe. If you are receiving skin tests for allergies, tell your care team you are using this medication. What side effects may I notice from receiving this medication? Side effects that you should report to your care team as soon as possible: Allergic reactions--skin rash, itching, hives, swelling of the face, lips, tongue, or throat Heart rhythm changes--fast or irregular heartbeat, dizziness, feeling faint or lightheaded, chest pain, trouble breathing Side effects that usually do not require medical attention (report to your care team if they continue or are bothersome): Confusion Drowsiness Dry mouth Hallucinations Headache This list may not describe all possible side effects. Call your doctor for medical advice about side effects. You may report side effects to FDA at 1-800-FDA-1088. Where should I keep my medication? Keep out of the reach of children and pets. Store at room temperature between 15 and 30 degrees C (59 and 86 degrees F). Keep container tightly closed. Throw away any unused medication after the expiration date. NOTE: This sheet is a summary. It may not cover all possible information. If you have questions about this medicine, talk to your doctor, pharmacist, or health care provider.  2023 Elsevier/Gold Standard (2020-10-03 00:00:00) Mirtazapine Tablets What is this medication? MIRTAZAPINE (mir TAZ a peen) treats depression. It  increases the amount of serotonin and norepinephrine in the brain, hormones that help regulate mood. This medicine may be used for other purposes; ask your health care provider or pharmacist if you have questions. COMMON BRAND NAME(S): Remeron What should I tell my care team before I take this medication? They need to know if you have any of these conditions: Bipolar disorder Glaucoma Kidney disease Liver disease Suicidal thoughts An unusual or allergic reaction to mirtazapine, other medications, foods, dyes, or preservatives Pregnant or trying to get pregnant Breast-feeding How should I use this medication? Take this medication by mouth with a glass of water. Follow the directions on the prescription label. Take your medication at regular intervals. Do not take your medication more often than directed. Do not stop taking this medication suddenly except upon the advice of your care team. Stopping this medication too quickly may cause serious side effects or your condition may worsen. A special MedGuide will be given to you by the pharmacist with each prescription and refill. Be sure to read this information carefully each time. Talk to your care team about the use of this medication in children. Special care may be needed. Overdosage: If you think you have taken too much of this medicine contact a poison control center or emergency room at once. NOTE: This medicine is only for you. Do not share this medicine with others. What if I miss a dose? If you miss a dose, take it as soon as you can. If it is almost time for your next dose, take only that dose. Do not take double or extra doses. What may interact  with this medication? Do not take this medication with any of the following: Linezolid MAOIs like Carbex, Eldepryl, Marplan, Nardil, and Parnate Methylene blue (injected into a vein) This medication may also interact with the following: Alcohol Antiviral medications for HIV or  AIDS Certain medications that treat or prevent blood clots like warfarin Certain medications for depression, anxiety, or psychotic disturbances Certain medications for fungal infections like ketoconazole and itraconazole Certain medications for migraine headache like almotriptan, eletriptan, frovatriptan, naratriptan, rizatriptan, sumatriptan, zolmitriptan Certain medications for seizures like carbamazepine or phenytoin Certain medications for sleep Cimetidine Erythromycin Fentanyl Lithium Medications for blood pressure Nefazodone Rasagiline Rifampin Supplements like St. John's wort, kava kava, valerian Tramadol Tryptophan This list may not describe all possible interactions. Give your health care provider a list of all the medicines, herbs, non-prescription drugs, or dietary supplements you use. Also tell them if you smoke, drink alcohol, or use illegal drugs. Some items may interact with your medicine. What should I watch for while using this medication? Tell your care team if your symptoms do not get better or if they get worse. Visit your care team for regular checks on your progress. Because it may take several weeks to see the full effects of this medication, it is important to continue your treatment as prescribed by your doctor. Patients and their families should watch out for new or worsening thoughts of suicide or depression. Also watch out for sudden changes in feelings such as feeling anxious, agitated, panicky, irritable, hostile, aggressive, impulsive, severely restless, overly excited and hyperactive, or not being able to sleep. If this happens, especially at the beginning of treatment or after a change in dose, call your care team. You may get drowsy or dizzy. Do not drive, use machinery, or do anything that needs mental alertness until you know how this medication affects you. Do not stand or sit up quickly, especially if you are an older patient. This reduces the risk of dizzy  or fainting spells. Alcohol may interfere with the effect of this medication. Avoid alcoholic drinks. This medication may cause dry eyes and blurred vision. If you wear contact lenses you may feel some discomfort. Lubricating drops may help. See your eye care team if the problem does not go away or is severe. Your mouth may get dry. Chewing sugarless gum or sucking hard candy, and drinking plenty of water may help. Contact your care team if the problem does not go away or is severe. What side effects may I notice from receiving this medication? Side effects that you should report to your care team as soon as possible: Allergic reactions--skin rash, itching, hives, swelling of the face, lips, tongue, or throat Heart rhythm changes--fast or irregular heartbeat, dizziness, feeling faint or lightheaded, chest pain, trouble breathing Infection--fever, chills, cough, or sore throat Irritability, confusion, fast or irregular heartbeat, muscle stiffness, twitching muscles, sweating, high fever, seizure, chills, vomiting, diarrhea, which may be signs of serotonin syndrome Low sodium level--muscle weakness, fatigue, dizziness, headache, confusion Rash, fever, and swollen lymph nodes Redness, blistering, peeling or loosening of the skin, including inside the mouth Seizures Sudden eye pain or change in vision such as blurry vision, seeing halos around lights, vision loss Thoughts of suicide or self-harm, worsening mood, feelings of depression Side effects that usually do not require medical attention (report to your care team if they continue or are bothersome): Constipation Dizziness Drowsiness Dry mouth Increase in appetite Weight gain This list may not describe all possible side effects. Call your doctor  for medical advice about side effects. You may report side effects to FDA at 1-800-FDA-1088. Where should I keep my medication? Keep out of the reach of children. Store at room temperature between 15  and 30 degrees C (59 and 86 degrees F) Protect from light and moisture. Throw away any unused medication after the expiration date. NOTE: This sheet is a summary. It may not cover all possible information. If you have questions about this medicine, talk to your doctor, pharmacist, or health care provider.  2023 Elsevier/Gold Standard (2020-11-27 00:00:00)
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
## 2022-04-27 ENCOUNTER — Ambulatory Visit: Payer: BC Managed Care – PPO

## 2022-04-27 ENCOUNTER — Ambulatory Visit: Payer: BC Managed Care – PPO | Attending: Internal Medicine

## 2022-04-27 DIAGNOSIS — M25512 Pain in left shoulder: Secondary | ICD-10-CM | POA: Insufficient documentation

## 2022-04-27 DIAGNOSIS — Z87828 Personal history of other (healed) physical injury and trauma: Secondary | ICD-10-CM | POA: Diagnosis not present

## 2022-04-27 DIAGNOSIS — M542 Cervicalgia: Secondary | ICD-10-CM | POA: Insufficient documentation

## 2022-04-27 DIAGNOSIS — M6281 Muscle weakness (generalized): Secondary | ICD-10-CM | POA: Diagnosis present

## 2022-04-27 DIAGNOSIS — R293 Abnormal posture: Secondary | ICD-10-CM | POA: Insufficient documentation

## 2022-04-27 DIAGNOSIS — G8929 Other chronic pain: Secondary | ICD-10-CM | POA: Diagnosis not present

## 2022-04-27 DIAGNOSIS — M5412 Radiculopathy, cervical region: Secondary | ICD-10-CM | POA: Insufficient documentation
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
## 2022-04-29 ENCOUNTER — Ambulatory Visit: Payer: BC Managed Care – PPO

## 2022-04-30 ENCOUNTER — Ambulatory Visit: Payer: BC Managed Care – PPO

## 2022-04-30 DIAGNOSIS — M542 Cervicalgia: Secondary | ICD-10-CM

## 2022-04-30 DIAGNOSIS — M5412 Radiculopathy, cervical region: Secondary | ICD-10-CM

## 2022-04-30 DIAGNOSIS — R293 Abnormal posture: Secondary | ICD-10-CM | POA: Diagnosis not present

## 2022-04-30 DIAGNOSIS — M6281 Muscle weakness (generalized): Secondary | ICD-10-CM
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
## 2022-05-04 ENCOUNTER — Ambulatory Visit: Payer: BC Managed Care – PPO

## 2022-05-06 ENCOUNTER — Ambulatory Visit: Payer: BC Managed Care – PPO

## 2022-05-06 ENCOUNTER — Other Ambulatory Visit: Payer: Self-pay | Admitting: Orthopedic Surgery

## 2022-05-06 DIAGNOSIS — M5412 Radiculopathy, cervical region: Secondary | ICD-10-CM

## 2022-05-06 DIAGNOSIS — M542 Cervicalgia: Secondary | ICD-10-CM

## 2022-05-08 ENCOUNTER — Ambulatory Visit: Payer: BC Managed Care – PPO | Admitting: Internal Medicine
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
## 2022-05-12 ENCOUNTER — Ambulatory Visit: Payer: BC Managed Care – PPO

## 2022-05-13 ENCOUNTER — Ambulatory Visit: Payer: BC Managed Care – PPO

## 2022-05-19 ENCOUNTER — Ambulatory Visit
Admission: RE | Admit: 2022-05-19 | Discharge: 2022-05-19 | Disposition: A | Discharge status: Home / Self Care | Payer: BC Managed Care – PPO | Source: Ambulatory Visit | Attending: Orthopedic Surgery | Admitting: Orthopedic Surgery

## 2022-05-19 DIAGNOSIS — M5412 Radiculopathy, cervical region: Secondary | ICD-10-CM

## 2022-05-19 DIAGNOSIS — M542 Cervicalgia: Secondary | ICD-10-CM
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
## 2022-05-20 ENCOUNTER — Ambulatory Visit: Payer: Self-pay | Attending: Internal Medicine

## 2022-05-20 ENCOUNTER — Telehealth: Payer: Self-pay

## 2022-05-20 ENCOUNTER — Ambulatory Visit: Payer: BC Managed Care – PPO | Admitting: Internal Medicine

## 2022-05-20 NOTE — Telephone Encounter (Signed)
Unable to leave message. Automated response from the number provided stated: "please enter your mailbox number."  Pt mailbox number unknown.

## 2022-05-25 ENCOUNTER — Ambulatory Visit: Payer: Self-pay

## 2022-05-26 ENCOUNTER — Ambulatory Visit: Payer: Self-pay

## 2022-05-28 ENCOUNTER — Ambulatory Visit: Payer: Self-pay

## 2022-05-28 ENCOUNTER — Ambulatory Visit: Payer: BC Managed Care – PPO | Admitting: Internal Medicine

## 2022-05-28 ENCOUNTER — Encounter: Payer: Self-pay | Admitting: Internal Medicine
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
## 2022-05-30 ENCOUNTER — Other Ambulatory Visit: Payer: Self-pay | Admitting: Internal Medicine

## 2022-06-01 ENCOUNTER — Ambulatory Visit: Payer: BC Managed Care – PPO | Admitting: Psychiatry
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
## 2022-07-14 ENCOUNTER — Ambulatory Visit: Payer: Medicaid Other | Admitting: Internal Medicine

## 2022-07-21 ENCOUNTER — Encounter: Payer: Self-pay | Admitting: Internal Medicine

## 2022-07-21 ENCOUNTER — Ambulatory Visit: Payer: Self-pay | Admitting: Internal Medicine
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
## 2022-07-28 ENCOUNTER — Ambulatory Visit: Payer: Self-pay | Admitting: Internal Medicine

## 2022-08-28 ENCOUNTER — Ambulatory Visit
Admission: RE | Admit: 2022-08-28 | Discharge: 2022-08-28 | Disposition: A | Discharge status: Home / Self Care | Payer: Self-pay | Source: Ambulatory Visit | Attending: Neurosurgery | Admitting: Neurosurgery

## 2022-08-28 ENCOUNTER — Other Ambulatory Visit: Payer: Self-pay

## 2022-08-28 DIAGNOSIS — Z049 Encounter for examination and observation for unspecified reason: Secondary | ICD-10-CM

## 2022-09-16 NOTE — Progress Notes (Deleted)
Referring Physician:  Teodora Medici, [address] [place],  [place] [address]  Primary Physician:  Teodora Medici, DO  History of Present Illness: 09/16/2022 Erik Snyder is here today with a chief complaint of *** left-sided neck pain with radiation into the left parascapular region, diffusely through the upper and lower arm, and associated with diffuse numbness involving the entire arm in all 5 digits   Duration: ***April 2023 Location: *** Quality: ***pressure, tight Severity: *** 10/10 Precipitating: aggravated by *** movement/lifting the arm Modifying factors: made better by ***nothing? Weakness: none Timing: ***constant Bowel/Bladder Dysfunction: none  Conservative measures:  Physical therapy: *** has participated in 2 visits at [facility], he wasn't discharged.  Multimodal medical therapy including regular antiinflammatories: *** flexeril, ibuprofen, meloxicam, gabapentin, tramadol Injections: *** has not received any epidural steroid injections  Past Surgery: ***denies  Tuck Dulworth has ***no symptoms of cervical myelopathy.  The symptoms are causing a significant impact on the patient's life.   I have utilized the care everywhere function in epic to review the outside records available from external health systems.  Review of Systems:  A 10 point review of systems is negative, except for the pertinent positives and negatives detailed in the HPI.  Past Medical History: Past Medical History:  Diagnosis Date   Healthcare maintenance 06/15/2011   History of GI bleed    PTSD (post-traumatic stress disorder) 04/23/2022    Past Surgical History: Past Surgical History:  Procedure Laterality Date   COLONOSCOPY      Allergies: Allergies as of 09/17/2022 - Review Complete 04/30/2022  Allergen Reaction Noted   Eggs or egg-derived products Nausea And Vomiting 01/26/2022   Morphine Itching 06/15/2011   Morphine and related  Itching 10/27/2016    Medications: No outpatient medications have been marked as taking for the 09/17/22 encounter (Appointment) with Meade Maw, MD.    Social History: Social History   Tobacco Use   Smoking status: Every Day    Packs/day: 0.25    Types: Cigarettes   Smokeless tobacco: Never  Vaping Use   Vaping Use: Never used  Substance Use Topics   Alcohol use: Not Currently    Comment: special   Drug use: No    Family Medical History: Family History  Problem Relation Age of Onset   Drug abuse Mother    Drug abuse Father    Kidney disease Father    Dementia Maternal Aunt     Physical Examination: There were no vitals filed for this visit.  General: Patient is well developed, well nourished, calm, collected, and in no apparent distress. Attention to examination is appropriate.  Neck:   Supple.  Full range of motion.  Respiratory: Patient is breathing without any difficulty.   NEUROLOGICAL:     Awake, alert, oriented to person, place, and time.  Speech is clear and fluent.   Cranial Nerves: Pupils equal round and reactive to light.  Facial tone is symmetric.  Facial sensation is symmetric. Shoulder shrug is symmetric. Tongue protrusion is midline.  There is no pronator drift.  ROM of spine: full.    Strength: Side Biceps Triceps Deltoid Interossei Grip Wrist Ext. Wrist Flex.  R 5 5 5 5 5 5 5   L 5 5 5 5 5 5 5    Side Iliopsoas Quads Hamstring PF DF EHL  R 5 5 5 5 5 5   L 5 5 5 5 5 5    Reflexes are ***2+ and symmetric at the biceps,  triceps, brachioradialis, patella and achilles.   Hoffman's is absent.   Bilateral upper and lower extremity sensation is intact to light touch.    No evidence of dysmetria noted.  Gait is normal.     Medical Decision Making  Imaging: ***  I have personally reviewed the images and agree with the above interpretation.  Assessment and Plan: [name] is a pleasant 47 y.o. male with ***    Thank you for  involving me in the care of this patient.      Chester K. Izora Ribas MD, [facility] Neurosurgery

## 2022-09-17 ENCOUNTER — Ambulatory Visit: Payer: Medicaid Other | Admitting: Neurosurgery

## 2022-09-30 NOTE — Progress Notes (Deleted)
Referring Physician:  Teodora Medici, [address],  [place] [address]  Primary Physician:  Teodora Medici, DO  History of Present Illness: 09/30/2022 [name] is here today with a chief complaint of *** left-sided neck pain with radiation into the left parascapular region, diffusely through the upper and lower arm, and associated with diffuse numbness involving the entire arm in all 5 digits    Duration: ***April 2023 Location: *** Quality: ***pressure, tight Severity: *** 10/10 Precipitating: aggravated by ***movement/lifting the arm? Modifying factors: made better by ***nothing? Weakness: none Timing: ***constant Bowel/Bladder Dysfunction: none  Conservative measures:  Physical therapy: *** has participated in 2 visits at [facility], he wasn't discharged.  Multimodal medical therapy including regular antiinflammatories: ***  flexeril, ibuprofen, meloxicam, gabapentin, tramadol  Injections: *** has not received any epidural steroid injections  Past Surgery: ***denies  Erik Snyder has ***no symptoms of cervical myelopathy.  The symptoms are causing a significant impact on the patient's life.   I have utilized the care everywhere function in epic to review the outside records available from external health systems.  Review of Systems:  A 10 point review of systems is negative, except for the pertinent positives and negatives detailed in the HPI.  Past Medical History: Past Medical History:  Diagnosis Date   Healthcare maintenance 06/15/2011   History of GI bleed    PTSD (post-traumatic stress disorder) 04/23/2022    Past Surgical History: Past Surgical History:  Procedure Laterality Date   COLONOSCOPY      Allergies: Allergies as of 10/01/2022 - Review Complete 04/30/2022  Allergen Reaction Noted   Eggs or egg-derived products Nausea And Vomiting 01/26/2022   Morphine Itching 06/15/2011   Morphine and related  Itching 10/27/2016    Medications: No outpatient medications have been marked as taking for the 10/01/22 encounter (Appointment) with Meade Maw, MD.    Social History: Social History   Tobacco Use   Smoking status: Every Day    Packs/day: 0.25    Types: Cigarettes   Smokeless tobacco: Never  Vaping Use   Vaping Use: Never used  Substance Use Topics   Alcohol use: Not Currently    Comment: special   Drug use: No    Family Medical History: Family History  Problem Relation Age of Onset   Drug abuse Mother    Drug abuse Father    Kidney disease Father    Dementia Maternal Aunt     Physical Examination: There were no vitals filed for this visit.  General: Patient is well developed, well nourished, calm, collected, and in no apparent distress. Attention to examination is appropriate.  Neck:   Supple.  Full range of motion.  Respiratory: Patient is breathing without any difficulty.   NEUROLOGICAL:     Awake, alert, oriented to person, place, and time.  Speech is clear and fluent.   Cranial Nerves: Pupils equal round and reactive to light.  Facial tone is symmetric.  Facial sensation is symmetric. Shoulder shrug is symmetric. Tongue protrusion is midline.  There is no pronator drift.  ROM of spine: full.    Strength: Side Biceps Triceps Deltoid Interossei Grip Wrist Ext. Wrist Flex.  R 5 5 5 5 5 5 5  $ L 5 5 5 5 5 5 5   $ Side Iliopsoas Quads Hamstring PF DF EHL  R 5 5 5 5 5 5  $ L 5 5 5 5 5 5   $ Reflexes are ***2+ and symmetric at  the biceps, triceps, brachioradialis, patella and achilles.   Hoffman's is absent.   Bilateral upper and lower extremity sensation is intact to light touch.    No evidence of dysmetria noted.  Gait is normal.     Medical Decision Making  Imaging: ***  I have personally reviewed the images and agree with the above interpretation.  Assessment and Plan: [name] is a pleasant 47 y.o. male with ***    Thank you for  involving me in the care of this patient.      Chester K. Izora Ribas MD, [facility] Neurosurgery

## 2022-10-01 ENCOUNTER — Ambulatory Visit: Payer: Medicaid Other | Admitting: Neurosurgery
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
## 2022-11-12 ENCOUNTER — Ambulatory Visit (INDEPENDENT_AMBULATORY_CARE_PROVIDER_SITE_OTHER): Payer: 59 | Admitting: Neurosurgery

## 2022-11-12 ENCOUNTER — Encounter: Payer: Self-pay | Admitting: Neurosurgery

## 2022-11-12 ENCOUNTER — Telehealth: Payer: Self-pay

## 2022-11-12 VITALS — BP 118/70 | HR 93 | Ht 70.0 in | Wt 148.6 lb

## 2022-11-12 DIAGNOSIS — R200 Anesthesia of skin: Secondary | ICD-10-CM

## 2022-11-12 DIAGNOSIS — R29898 Other symptoms and signs involving the musculoskeletal system: Secondary | ICD-10-CM

## 2022-11-12 NOTE — Telephone Encounter (Signed)
Referral faxed on 11/12/2022

## 2022-11-12 NOTE — Telephone Encounter (Signed)
Dr. Izora Ribas has placed a order for a EMG. Can you please send this to Neurology.

## 2022-11-18 ENCOUNTER — Telehealth: Payer: Self-pay

## 2022-11-18 NOTE — Telephone Encounter (Signed)
I spoke with Mr Erik Snyder. In the past, he has tried flexeril, gabapentin, and tramadol. He reports he had issues with mood swings, so he stopped taking all of them. He is not sure which one of these medications caused issues with his mood, as he was taking all 3 at the same time. His arm is really bothering him and he would like to try something to help with the arm pain. He inquired about percocet, but I informed him Dr Izora Ribas only prescribes narcotics for a short duration after surgery. He did say he is willing to try gabapentin again since he is unsure if that is what previously caused his mood issues.

## 2022-11-18 NOTE — Telephone Encounter (Signed)
-----   Message from Peggyann Shoals sent at 11/18/2022  3:22 PM EST ----- Regarding: medication Contact: [phone] He forgot to request medication for his left arm nerve pain. Can something be sent to [facility]. He is aware that Dr.Yarbrough is in surgery today.

## 2022-11-19 ENCOUNTER — Other Ambulatory Visit: Payer: Self-pay | Admitting: Neurosurgery

## 2022-11-19 MED ORDER — GABAPENTIN 300 MG PO CAPS: 300.0000 mg | ORAL_CAPSULE | Freq: Three times a day (TID) | ORAL | 0 refills | Status: DC

## 2022-11-19 NOTE — Telephone Encounter (Signed)
Patient notified of medication refill.

## 2022-11-24 NOTE — Telephone Encounter (Signed)
Patient has not been scheduled yet, they will contact him now.

## 2022-11-30 NOTE — Telephone Encounter (Signed)
01/25/2023 at 10am

## 2023-02-01 NOTE — Telephone Encounter (Signed)
#  [id number]

## 2023-02-17 DIAGNOSIS — R200 Anesthesia of skin: Secondary | ICD-10-CM | POA: Diagnosis not present

## 2023-03-01 NOTE — Telephone Encounter (Signed)
No results as of right now.

## 2023-03-03 NOTE — Telephone Encounter (Deleted)
Do you want [place] to ask Marylu Lund in specialty scheduling to contact him again regarding the myelogram or is there a change in plans based on his EMG results?

## 2023-03-03 NOTE — Telephone Encounter (Signed)
EMG results are in chart. CT myelogram hasn't been scheduled or completed. Note from 11/2022 states he declined to schedule at that time b/c he doesn't want to be scheduled until the first of May 2024.

## 2023-03-04 NOTE — Telephone Encounter (Signed)
Patient states he has [employer] and Medicaid. Those insurances are already in his chart.

## 2023-03-04 NOTE — Telephone Encounter (Signed)
Noted. I will obtain authorization.  

## 2023-03-10 ENCOUNTER — Telehealth: Payer: Self-pay | Admitting: Neurosurgery

## 2023-03-10 DIAGNOSIS — R200 Anesthesia of skin: Secondary | ICD-10-CM

## 2023-03-10 MED ORDER — DIAZEPAM 5 MG PO TABS: ORAL_TABLET | ORAL | 0 refills | Status: DC

## 2023-03-10 NOTE — Telephone Encounter (Addendum)
He doesn't have to have it done if he doesn't want to, but if he wants to continue to investigate what is going on, then a CT myelogram is the next step and only option. 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  Per discussion with specialty scheduling, this study is not offered under general anesthesia or moderate sedation at [facility]. If we prescribe something like valium, he will have to wait until after he signs the consent to take it and he will need a driver to take him home.

## 2023-03-10 NOTE — Telephone Encounter (Signed)
DG FL LP INJECT MYELOGRAM 04/02/2023 Patient has questions He does not like needles and he wants to make sure that this procedure is necessary? Can he be put to sleep to have the procedure done? What can you recommend for the patient to take to relax him for the procedure?
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
## 2023-03-10 NOTE — Telephone Encounter (Signed)
Valium okay for prior to procedure. PMP reviewed and is appropriate.

## 2023-03-11 ENCOUNTER — Encounter: Payer: Self-pay | Admitting: Neurosurgery

## 2023-04-01 MED ORDER — DIAZEPAM 5 MG PO TABS: ORAL_TABLET | ORAL | 0 refills | Status: DC

## 2023-04-01 NOTE — Telephone Encounter (Signed)
Can you resend valium to [facility]. He did not pick it up soon enough and they put it back on the shelf. He has his procedure tomorrow.

## 2023-04-01 NOTE — Telephone Encounter (Signed)
Patient is aware of the refill and agreed to [facility] directions.

## 2023-04-01 NOTE — Addendum Note (Signed)
Addended byDrake Leach on: 04/01/2023 01:01 PM   Modules accepted: Orders

## 2023-04-01 NOTE — Progress Notes (Signed)
Patient for DG Cervical/ CT Cervical Myelogram on Friday 04/02/2023, I called and spoke with the patient on the phone and gave pre-procedure instructions. Pt was made aware to be here at 9:30a. Pt stated understanding.  Called 03/09/2023 and spoke with patient/ tried to call on 04/01/2023 and couldn't reach patient, although I see that the patient has responded that he will be by his MyChart.

## 2023-04-01 NOTE — Telephone Encounter (Signed)
PMP reviewed and is appropriate. He did not fill valium from 03/10/23.   Valium sent to pharmacy again. Remind him that he needs a driver and that he cannot take the medication until he signs the consent.

## 2023-04-02 ENCOUNTER — Ambulatory Visit
Admission: RE | Admit: 2023-04-02 | Discharge: 2023-04-02 | Disposition: A | Discharge status: Home / Self Care | Payer: 59 | Source: Ambulatory Visit | Attending: Neurosurgery | Admitting: Neurosurgery

## 2023-04-02 ENCOUNTER — Telehealth: Payer: Self-pay | Admitting: Neurosurgery

## 2023-04-02 ENCOUNTER — Telehealth: Payer: Self-pay

## 2023-04-02 DIAGNOSIS — R200 Anesthesia of skin: Secondary | ICD-10-CM | POA: Diagnosis not present

## 2023-04-02 DIAGNOSIS — J439 Emphysema, unspecified: Secondary | ICD-10-CM | POA: Diagnosis not present

## 2023-04-02 DIAGNOSIS — M4804 Spinal stenosis, thoracic region: Secondary | ICD-10-CM | POA: Diagnosis not present

## 2023-04-02 DIAGNOSIS — R29898 Other symptoms and signs involving the musculoskeletal system: Secondary | ICD-10-CM | POA: Diagnosis not present

## 2023-04-02 DIAGNOSIS — M79602 Pain in left arm: Secondary | ICD-10-CM

## 2023-04-02 DIAGNOSIS — M47814 Spondylosis without myelopathy or radiculopathy, thoracic region: Secondary | ICD-10-CM | POA: Insufficient documentation

## 2023-04-02 DIAGNOSIS — M50322 Other cervical disc degeneration at C5-C6 level: Secondary | ICD-10-CM | POA: Diagnosis not present

## 2023-04-02 DIAGNOSIS — M4802 Spinal stenosis, cervical region: Secondary | ICD-10-CM | POA: Insufficient documentation

## 2023-04-02 MED ORDER — MELOXICAM 15 MG PO TABS: 15.0000 mg | ORAL_TABLET | Freq: Every day | ORAL | 0 refills | Status: DC

## 2023-04-02 MED ORDER — ACETAMINOPHEN 500 MG PO TABS: 1000.0000 mg | ORAL_TABLET | Freq: Four times a day (QID) | ORAL | Status: DC | PRN

## 2023-04-02 MED ORDER — IOHEXOL 300 MG/ML  SOLN
50.0000 mL | Freq: Once | INTRAMUSCULAR | Status: AC | PRN
  Administered 2023-04-02: 10 mL

## 2023-04-02 MED ORDER — DICLOFENAC SODIUM 75 MG PO TBEC: 75.0000 mg | DELAYED_RELEASE_TABLET | Freq: Two times a day (BID) | ORAL | 0 refills | Status: DC

## 2023-04-02 MED ORDER — LIDOCAINE 1 % OPTIME INJ - NO CHARGE
10.0000 mL | Freq: Once | INTRAMUSCULAR | Status: AC
  Administered 2023-04-02: 5 mL
  Filled 2023-04-02: qty 10

## 2023-04-02 NOTE — Addendum Note (Signed)
Addended byDrake Leach on: 04/02/2023 04:19 PM   Modules accepted: Orders

## 2023-04-02 NOTE — Addendum Note (Signed)
Addended byDrake Leach on: 04/02/2023 05:20 PM   Modules accepted: Orders

## 2023-04-02 NOTE — Telephone Encounter (Signed)
Please try to call [facility] on Monday to cancel diclofenac.   I have sent it to the [facility] in [place].

## 2023-04-02 NOTE — Telephone Encounter (Signed)
Prior authorization has been started for diclofenac, this is pending.   PA Case ID #: [id number]

## 2023-04-02 NOTE — Telephone Encounter (Signed)
I can send diclofenac to his pharmacy to help with pain and inflammation. He should take with food.   Please let him know this was sent to [facility] in [place].

## 2023-04-02 NOTE — Telephone Encounter (Signed)
Spoke with the patient he agreed to try the diclofenac. Please re-send to [facility] in [place], I have updated this in the chart and also canceled the prescription at [facility] in [place].

## 2023-04-02 NOTE — Telephone Encounter (Signed)
Patient had his imaging done today. Laying on the table has caused his symptoms to flare up. Can he get pain medication for the weekend called to [facility] in [place] in [place].

## 2023-04-02 NOTE — Telephone Encounter (Signed)
Authorization has been denied:  On 04/02/2023, we asked your provider for the following important facts or documents: Per your health plan's criteria, this drug is covered if you meet the following: The use of this drug is supported (for a Food and Drug Administration-approved indication or by an appropriate compendia of current literature): treatment of a type of joint pain (osteoarthritis, ankylosing spondylitis and rheumatoid arthritis)

## 2023-04-02 NOTE — Telephone Encounter (Signed)
I spoke with Mr Tiedt. I informed him that his insurance denied diclofenac and that [facility] is going to send a different medication that will be once a day. He would like this sent to [facility] in [place]. I informed him that if this 2nd option isn't covered, he can see how much the cash price is, try the Goodrx app for a coupon, or take ibuprofen.

## 2023-04-02 NOTE — Telephone Encounter (Signed)
I attempted to call [facility] in [place] and the recording says the pharmacy is closed.

## 2023-04-02 NOTE — Telephone Encounter (Addendum)
Auth for diclofenac was denied.  Per your health plan's criteria, this drug is covered if you meet the following: The use of this drug is supported (for a Food and Drug Administration-approved indication or by an appropriate compendia of current literature): treatment of a type of joint pain (osteoarthritis, ankylosing spondylitis and rheumatoid arthritis)   Notified [facility] in [place] to cancel the rx (Cost is $58 cash price). Goodrx price is still $28.   I called [facility] in [place] and canceled the diclofenac rx with them.  Will send to [facility] to send another med.

## 2023-04-02 NOTE — Telephone Encounter (Signed)
Notified Stacy. We will send rx for a different medication. Notified [facility] in [place] to cancel the rx.

## 2023-04-02 NOTE — Telephone Encounter (Signed)
Voltaren not covered.   Will try mobic. This was sent to [facility] in [place] [address]. Kendelyn called and let him know. See her phone call.

## 2023-04-06 ENCOUNTER — Ambulatory Visit (INDEPENDENT_AMBULATORY_CARE_PROVIDER_SITE_OTHER): Payer: 59 | Admitting: Neurosurgery

## 2023-04-06 ENCOUNTER — Encounter: Payer: Self-pay | Admitting: Neurosurgery

## 2023-04-06 VITALS — BP 138/84 | Wt 151.0 lb

## 2023-04-06 DIAGNOSIS — R200 Anesthesia of skin: Secondary | ICD-10-CM | POA: Diagnosis not present

## 2023-04-06 DIAGNOSIS — G540 Brachial plexus disorders: Secondary | ICD-10-CM | POA: Diagnosis not present

## 2023-04-06 DIAGNOSIS — S41032S Puncture wound without foreign body of left shoulder, sequela: Secondary | ICD-10-CM

## 2023-04-06 DIAGNOSIS — M79602 Pain in left arm: Secondary | ICD-10-CM | POA: Diagnosis not present

## 2023-04-06 DIAGNOSIS — M5412 Radiculopathy, cervical region: Secondary | ICD-10-CM
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
## 2023-04-07 ENCOUNTER — Telehealth: Payer: Self-pay | Admitting: Neurosurgery

## 2023-04-07 MED ORDER — GABAPENTIN 300 MG PO CAPS: ORAL_CAPSULE | ORAL | 0 refills | Status: DC

## 2023-04-07 NOTE — Telephone Encounter (Signed)
I spoke with Erik Snyder. He states he has been taking gabapentin 300mg  three times per day (consistent with what is on his medication list).  The last gabapentin prescription we sent in for him was in March and was for 90 capsules with no refills. I brought this to his attention, and he again confirmed that he has been taking 1 capsule 3 times per day.  Dr Lucienne Capers note 04/06/23 states he discussed increasing his gabapentin to 900mg  at nighttime. I informed him that increased doses of gabapentin can cause side effects such as fatigue/fogginess.  He stated that he would prefer the 900mg  be in 1 dose. I discussed that I do not think his insurance would pay for 2 different prescriptions for 2 different doses. I discussed this with Dr Katrinka Blazing, who agreed. Rx sent for gabapentin TID (300mg , 300mg , 900mg ).

## 2023-04-07 NOTE — Telephone Encounter (Signed)
Dr. Myer Haff increased his Gabapentin yesterday. Can the patient get a new rx sent to [id number].

## 2023-04-21 ENCOUNTER — Telehealth: Payer: Self-pay | Admitting: Neurosurgery

## 2023-04-21 NOTE — Telephone Encounter (Signed)
Patient calling that the gabapentin is not helping with the pain. He feels it just knocks him out. On 7/24 he called an asked for an increase in the dosage. Should he give it more time to work? Is there something else that maybe he can take for pain? He is aware that Dr. Myer Haff is not in the office until next Tuesday.

## 2023-04-25 DIAGNOSIS — L03011 Cellulitis of right finger: Secondary | ICD-10-CM | POA: Diagnosis not present

## 2023-05-20 ENCOUNTER — Ambulatory Visit: Payer: 59 | Admitting: Neurosurgery

## 2023-06-08 ENCOUNTER — Ambulatory Visit: Payer: 59 | Admitting: Neurosurgery
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
## 2023-06-15 ENCOUNTER — Ambulatory Visit (INDEPENDENT_AMBULATORY_CARE_PROVIDER_SITE_OTHER): Payer: 59 | Admitting: Neurosurgery

## 2023-06-15 ENCOUNTER — Encounter: Payer: Self-pay | Admitting: Neurosurgery

## 2023-06-15 VITALS — BP 110/78 | Ht 70.0 in | Wt 151.0 lb

## 2023-06-15 DIAGNOSIS — S41032S Puncture wound without foreign body of left shoulder, sequela: Secondary | ICD-10-CM

## 2023-06-15 DIAGNOSIS — R200 Anesthesia of skin: Secondary | ICD-10-CM

## 2023-06-15 DIAGNOSIS — M79602 Pain in left arm: Secondary | ICD-10-CM | POA: Diagnosis not present

## 2023-06-15 DIAGNOSIS — M5412 Radiculopathy, cervical region: Secondary | ICD-10-CM | POA: Diagnosis not present
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
## 2023-06-22 ENCOUNTER — Ambulatory Visit: Payer: 59 | Admitting: Physical Therapy

## 2023-06-29 ENCOUNTER — Encounter: Payer: 59 | Admitting: Physical Therapy

## 2023-07-06 ENCOUNTER — Encounter: Payer: 59 | Admitting: Physical Therapy

## 2023-07-20 ENCOUNTER — Encounter: Payer: 59 | Admitting: Physical Therapy

## 2023-07-20 ENCOUNTER — Ambulatory Visit: Payer: 59 | Admitting: Physical Therapy

## 2023-07-27 ENCOUNTER — Encounter: Payer: 59 | Admitting: Physical Therapy

## 2023-08-03 ENCOUNTER — Encounter: Payer: 59 | Admitting: Physical Therapy

## 2023-08-17 ENCOUNTER — Encounter: Payer: 59 | Admitting: Physical Therapy

## 2023-08-24 ENCOUNTER — Encounter: Payer: 59 | Admitting: Physical Therapy

## 2023-08-31 ENCOUNTER — Encounter: Payer: 59 | Admitting: Physical Therapy

## 2023-09-14 ENCOUNTER — Encounter: Payer: 59 | Admitting: Physical Therapy

## 2023-09-16 ENCOUNTER — Ambulatory Visit: Payer: 59 | Attending: Neurosurgery | Admitting: Physical Therapy

## 2023-09-16 ENCOUNTER — Telehealth: Payer: Self-pay | Admitting: Physical Therapy

## 2023-09-16 DIAGNOSIS — M5412 Radiculopathy, cervical region: Secondary | ICD-10-CM | POA: Insufficient documentation

## 2023-09-16 DIAGNOSIS — M542 Cervicalgia: Secondary | ICD-10-CM | POA: Insufficient documentation

## 2023-09-16 NOTE — Telephone Encounter (Signed)
 Called pt to inquire about absence from PT. Pt reports that he completely forgot about his apt and that he does plan on being at [facility] apt at 9 am.

## 2023-09-20 ENCOUNTER — Ambulatory Visit: Payer: 59 | Admitting: Physical Therapy

## 2023-09-20 ENCOUNTER — Encounter: Payer: Self-pay | Admitting: Physical Therapy

## 2023-09-20 DIAGNOSIS — M5412 Radiculopathy, cervical region: Secondary | ICD-10-CM

## 2023-09-20 DIAGNOSIS — M542 Cervicalgia: Secondary | ICD-10-CM
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
## 2023-09-22 ENCOUNTER — Ambulatory Visit: Payer: 59 | Admitting: Physical Therapy

## 2023-09-22 ENCOUNTER — Telehealth: Payer: Self-pay | Admitting: Physical Therapy

## 2023-09-22 NOTE — Telephone Encounter (Signed)
 Pt reports that he is having increased pain since last session and that he is unable to come in today. He would like to decrease appointment frequency to one time per week. PT confirmed that this change will be made to his schedule.

## 2023-09-27 ENCOUNTER — Ambulatory Visit: Payer: 59 | Admitting: Physical Therapy

## 2023-09-29 ENCOUNTER — Ambulatory Visit: Payer: 59 | Admitting: Physical Therapy

## 2023-10-04 ENCOUNTER — Ambulatory Visit: Payer: 59 | Admitting: Physical Therapy

## 2023-10-04 ENCOUNTER — Telehealth: Payer: Self-pay | Admitting: Physical Therapy

## 2023-10-04 NOTE — Telephone Encounter (Signed)
Patient has now reached three No Shows and he has been called several times without a response. He will now be taken off the schedule.

## 2023-10-06 ENCOUNTER — Encounter: Payer: 59 | Admitting: Physical Therapy

## 2023-10-11 ENCOUNTER — Encounter: Payer: 59 | Admitting: Physical Therapy

## 2023-10-11 ENCOUNTER — Other Ambulatory Visit: Payer: Self-pay | Admitting: Neurosurgery

## 2023-10-11 MED ORDER — GABAPENTIN 300 MG PO CAPS: ORAL_CAPSULE | ORAL | 0 refills | Status: AC

## 2023-10-13 ENCOUNTER — Encounter: Payer: 59 | Admitting: Physical Therapy

## 2023-10-18 ENCOUNTER — Encounter: Payer: 59 | Admitting: Physical Therapy

## 2023-10-20 ENCOUNTER — Encounter: Payer: 59 | Admitting: Physical Therapy

## 2023-10-25 ENCOUNTER — Encounter: Payer: 59 | Admitting: Physical Therapy

## 2023-10-27 ENCOUNTER — Encounter: Payer: 59 | Admitting: Physical Therapy

## 2023-11-01 ENCOUNTER — Encounter: Payer: 59 | Admitting: Physical Therapy

## 2023-11-03 ENCOUNTER — Encounter: Payer: 59 | Admitting: Physical Therapy

## 2023-11-15 ENCOUNTER — Other Ambulatory Visit: Payer: Self-pay

## 2023-11-15 ENCOUNTER — Emergency Department
Admission: EM | Admit: 2023-11-15 | Discharge: 2023-11-15 | Disposition: A | Discharge status: Home / Self Care | Attending: Emergency Medicine | Admitting: Emergency Medicine

## 2023-11-15 DIAGNOSIS — J069 Acute upper respiratory infection, unspecified: Secondary | ICD-10-CM | POA: Insufficient documentation

## 2023-11-15 DIAGNOSIS — Z87891 Personal history of nicotine dependence: Secondary | ICD-10-CM | POA: Insufficient documentation

## 2023-11-15 DIAGNOSIS — B9789 Other viral agents as the cause of diseases classified elsewhere: Secondary | ICD-10-CM | POA: Diagnosis not present

## 2023-11-15 DIAGNOSIS — R059 Cough, unspecified: Secondary | ICD-10-CM | POA: Diagnosis not present

## 2023-11-15 LAB — RESP PANEL BY RT-PCR (RSV, FLU A&B, COVID)  RVPGX2
Influenza A by PCR: NEGATIVE
Influenza B by PCR: NEGATIVE
Resp Syncytial Virus by PCR: NEGATIVE
SARS Coronavirus 2 by RT PCR: NEGATIVE

## 2023-11-15 MED ORDER — ALBUTEROL SULFATE HFA 108 (90 BASE) MCG/ACT IN AERS: 2.0000 | INHALATION_SPRAY | RESPIRATORY_TRACT | 0 refills | Status: AC | PRN

## 2023-11-15 MED ORDER — BENZONATATE 200 MG PO CAPS: 200.0000 mg | ORAL_CAPSULE | Freq: Three times a day (TID) | ORAL | 0 refills | Status: AC | PRN

## 2023-11-15 NOTE — ED Provider Notes (Signed)
 [facility] Provider Note    Event Date/Time   First MD Initiated Contact with Patient 11/15/23 0930     (approximate)   History   Cough   HPI  Erik Snyder is a 48 y.o. male with PMH of PTSD, anxiety disorder and tobacco use disorder who presents for evaluation of cough, congestion and bodyaches since Friday.  Patient states that he has had difficulty tasting and does not have much of an appetite.  He was recommended to be evaluated by people at his job as a work in close proximity together.      Physical Exam   Triage Vital Signs: ED Triage Vitals  Encounter Vitals Group     BP 11/15/23 0909 125/80     Systolic BP Percentile --      Diastolic BP Percentile --      Pulse Rate 11/15/23 0909 90     Resp 11/15/23 0909 17     Temp 11/15/23 0909 98.9 F (37.2 C)     Temp Source 11/15/23 0909 Oral     SpO2 11/15/23 0909 98 %     Weight --      Height --      Head Circumference --      Peak Flow --      Pain Score 11/15/23 0913 0     Pain Loc --      Pain Education --      Exclude from Growth Chart --     Most recent vital signs: Vitals:   11/15/23 0909  BP: 125/80  Pulse: 90  Resp: 17  Temp: 98.9 F (37.2 C)  SpO2: 98%   General: Awake, no distress.  CV:  Good peripheral perfusion.  RRR. Resp:  Normal effort.  CTAB. Abd:  No distention.  Other:  Oral mucous membranes are moist, no pharyngeal erythema.   ED Results / Procedures / Treatments   Labs (all labs ordered are listed, but only abnormal results are displayed) Labs Reviewed  RESP PANEL BY RT-PCR (RSV, FLU A&B, COVID)  [id number]      PROCEDURES:  Critical Care performed: No  Procedures   MEDICATIONS ORDERED IN ED: Medications - No data to display   IMPRESSION / MDM / ASSESSMENT AND PLAN / ED COURSE  I reviewed the triage vital signs and the nursing notes.                             48 year old male presents for evaluation of URI symptoms.  Vital  signs are stable patient NAD and nontoxic-appearing on exam.  Differential diagnosis includes, but is not limited to, flu, COVID, RSV, other viral illness, pneumonia, bronchitis.  Patient's presentation is most consistent with acute, uncomplicated illness.  Respiratory panel was negative.  Given that patient has only had 4 days of symptoms I suspect this is a viral URI.  We discussed symptomatic management using over-the-counter cold medicines, staying well-hydrated and increasing vitamin intake.  I will send a prescription for Tessalon pearls as well as an inhaler as patient describes feeling short of breath from the coughing.  He was given a note for work.  We reviewed return precautions.  He voiced understanding, all questions were answered and he was stable at discharge.      FINAL CLINICAL IMPRESSION(S) / ED DIAGNOSES   Final diagnoses:  Viral URI with cough     Rx / DC Orders  ED Discharge Orders          Ordered    albuterol (VENTOLIN HFA) 108 (90 Base) MCG/ACT inhaler  Every 4 hours PRN        11/15/23 1035    benzonatate (TESSALON) 200 MG capsule  3 times daily PRN        11/15/23 1035             Note:  This document was prepared using Dragon voice recognition software and may include unintentional dictation errors.   Cameron Ali, PA-C 11/15/23 1037    Merwyn Katos, MD 11/15/23 1538

## 2023-11-15 NOTE — ED Triage Notes (Signed)
 Patient states cold sweats, cough and body aches since Friday.

## 2023-11-15 NOTE — Discharge Instructions (Addendum)
 You tested negative for flu, COVID and RSV.  I believe you have another viral infection.  This will resolve on its own with time.  If your symptoms are not improving after a week please be seen by another healthcare provider.  I have sent an inhaler and some cough medicine to the pharmacy.  You will need to look for a medicine with a decongestant.  This can be bought over-the-counter.  Look for active ingredients including phenylephrine and pseudoephedrine.
